# Patient Record
Sex: Male | Born: 1970 | Race: Black or African American | Hispanic: No | Marital: Married | Smoking: Never smoker
Health system: Southern US, Community
[De-identification: ages and names within clinical notes are randomized; demographics above are authoritative.]

## PROBLEM LIST (undated history)

## (undated) DIAGNOSIS — T7840XA Allergy, unspecified, initial encounter: Secondary | ICD-10-CM

## (undated) DIAGNOSIS — R569 Unspecified convulsions: Secondary | ICD-10-CM

## (undated) DIAGNOSIS — J45909 Unspecified asthma, uncomplicated: Secondary | ICD-10-CM

## (undated) HISTORY — PX: OTHER SURGICAL HISTORY: SHX169

## (undated) HISTORY — DX: Allergy, unspecified, initial encounter: T78.40XA

## (undated) HISTORY — PX: KNEE ARTHROSCOPY: SUR90

## (undated) HISTORY — DX: Unspecified asthma, uncomplicated: J45.909

---

## 2001-04-29 DIAGNOSIS — R569 Unspecified convulsions: Secondary | ICD-10-CM

## 2001-04-29 HISTORY — DX: Unspecified convulsions: R56.9

## 2013-03-16 ENCOUNTER — Encounter (HOSPITAL_COMMUNITY): Payer: Self-pay | Admitting: Emergency Medicine

## 2013-03-16 ENCOUNTER — Emergency Department (HOSPITAL_COMMUNITY)
Admission: EM | Admit: 2013-03-16 | Discharge: 2013-03-16 | Disposition: A | Discharge status: Home / Self Care | Payer: BC Managed Care – PPO | Source: Home / Self Care | Attending: Emergency Medicine | Admitting: Emergency Medicine

## 2013-03-16 DIAGNOSIS — R569 Unspecified convulsions: Secondary | ICD-10-CM

## 2013-03-16 HISTORY — DX: Unspecified convulsions: R56.9

## 2013-03-16 MED ORDER — LAMOTRIGINE 100 MG PO TABS: 100.0000 mg | ORAL_TABLET | Freq: Every day | ORAL | Status: DC

## 2013-03-16 NOTE — ED Provider Notes (Signed)
Chief Complaint:   Chief Complaint  Patient presents with  . 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 He recently moved here from [facility] and has not yet found a primary care physician or neurologist. He has had seizures since 2001. He's only actually had 2 seizures. One occurred in 2001, then he was placed in the medical. At one point in the last 13 years, his neurologist tried to wean him off he'll make: He has another seizure. He was put back on it and has not had another seizure since that. He denies any medication side effects. He's had no other symptoms including no headaches, diplopia, blurry vision, numbness, tingling, paresthesias, weakness, difficulty with speech, swallowing, or ambulation. He has had no prior history of elevated blood pressure.  Review of Systems:  Other than noted above, the patient denies any of the following symptoms: Systemic:  No fever, chills, fatigue, photophobia, stiff neck. Eye:  No redness, eye pain, discharge, blurred vision, or diplopia. ENT:  No nasal congestion, rhinorrhea, sinus pressure or pain, sneezing, earache, or sore throat.  No jaw claudication. Neuro:  No paresthesias, loss of consciousness, seizure activity, muscle weakness, trouble with coordination or gait, trouble speaking or swallowing. Psych:  No depression, anxiety or trouble sleeping.  PMFSH:  Past medical history, family history, social history, meds, and allergies were reviewed.   Physical Exam:   Vital signs:  BP 160/106  Pulse 82  Temp(Src) 98 F (36.7 C) (Oral)  Resp 16  SpO2 95% General:  Alert and oriented.  In no distress. Eye:  Lids and conjunctivas normal.  PERRL,  Full EOMs.  Fundi benign with normal discs and vessels. ENT:  No cranial or facial tenderness to palpation.  TMs and canals clear.  Nasal mucosa was normal and uncongested without any drainage. No intra oral  lesions, pharynx clear, mucous membranes moist, dentition normal. Neck:  Supple, full ROM, no tenderness to palpation.  No adenopathy or mass. No carotid bruit. Lungs: Clear to auscultation. Heart: Regular rhythm, no gallop or murmur. Neuro:  Alert and orented times 3.  Speech was clear, fluent, and appropriate.  Cranial nerves intact. No pronator drift, muscle strength normal. Finger to nose normal.  DTRs were 2+ and symmetrical.Station and gait were normal.  Romberg's sign was normal.  Able to perform tandem gait well. Psych:  Normal affect.  Assessment:  The encounter diagnosis was Seizures.  I will give him enough medication to last for several months. He was given the name of a neurologist and a family physician to establish with in town here. Suggest he followup for his blood pressure in the near future.  Plan:   1.  Meds:  The following meds were prescribed:   Discharge Medication List as of 03/16/2013  2:40 PM    START taking these medications   Details  lamoTRIgine (LAMICTAL) 100 MG tablet Take 1 tablet (100 mg total) by mouth daily., Starting 03/16/2013, Until Discontinued, Normal        2.  Patient Education/Counseling:  The patient was given appropriate handouts, self care instructions, and instructed in symptomatic relief.    3.  Follow up:  The patient was told to follow up if no better in 3 to 4 days, if becoming worse in any way, and given some red flag symptoms such as any seizure activity or new neurological symptoms which would prompt immediate return.  Follow up with Dr. Everlena Cooper for his seizures, and with Dr. Maryelizabeth Rowan for primary care, including high blood pressure.      Reuben Likes, MD 03/16/13 [phone]

## 2013-03-16 NOTE — ED Notes (Signed)
Pt has recently moved here from [facility] and is asking for a refill of Lamictal     His last seizure was in 2001    He has no complaints

## 2013-03-19 ENCOUNTER — Telehealth (HOSPITAL_COMMUNITY): Payer: Self-pay | Admitting: *Deleted

## 2013-03-19 NOTE — ED Notes (Signed)
Pt. called on VM and said the Rx. for Lamictal was written for daily and he has taken it BID for 13 yrs.  The pharmacy only gave him #30 instead of the 60.  I showed it to Dr. Mikel Cella and she said BID is OK.  I told the pt. I would call the pharmacy and correct it.  I called [facility] on [address] @ [phone] and told the pharmacist it should be BID #60 with 3 refills. She said she would take care of it. Vassie Moselle 03/19/2013

## 2013-11-29 ENCOUNTER — Ambulatory Visit
Admission: RE | Admit: 2013-11-29 | Discharge: 2013-11-29 | Disposition: A | Discharge status: Home / Self Care | Payer: BC Managed Care – PPO | Source: Ambulatory Visit | Attending: Family Medicine | Admitting: Family Medicine

## 2013-11-29 ENCOUNTER — Other Ambulatory Visit: Payer: Self-pay | Admitting: Family Medicine

## 2013-11-29 DIAGNOSIS — R109 Unspecified abdominal pain: Secondary | ICD-10-CM

## 2014-01-20 ENCOUNTER — Emergency Department (HOSPITAL_COMMUNITY): Payer: BC Managed Care – PPO

## 2014-01-20 ENCOUNTER — Encounter (HOSPITAL_COMMUNITY): Payer: Self-pay | Admitting: Emergency Medicine

## 2014-01-20 ENCOUNTER — Emergency Department (HOSPITAL_COMMUNITY)
Admission: EM | Admit: 2014-01-20 | Discharge: 2014-01-20 | Disposition: A | Discharge status: Home / Self Care | Payer: BC Managed Care – PPO | Attending: Emergency Medicine | Admitting: Emergency Medicine

## 2014-01-20 DIAGNOSIS — W1809XA Striking against other object with subsequent fall, initial encounter: Secondary | ICD-10-CM | POA: Insufficient documentation

## 2014-01-20 DIAGNOSIS — W503XXA Accidental bite by another person, initial encounter: Secondary | ICD-10-CM | POA: Diagnosis not present

## 2014-01-20 DIAGNOSIS — S0993XA Unspecified injury of face, initial encounter: Secondary | ICD-10-CM | POA: Diagnosis not present

## 2014-01-20 DIAGNOSIS — S0990XA Unspecified injury of head, initial encounter: Secondary | ICD-10-CM | POA: Insufficient documentation

## 2014-01-20 DIAGNOSIS — G40909 Epilepsy, unspecified, not intractable, without status epilepticus: Secondary | ICD-10-CM | POA: Diagnosis present

## 2014-01-20 DIAGNOSIS — Z79899 Other long term (current) drug therapy: Secondary | ICD-10-CM | POA: Insufficient documentation

## 2014-01-20 DIAGNOSIS — R569 Unspecified convulsions: Secondary | ICD-10-CM

## 2014-01-20 DIAGNOSIS — Y9289 Other specified places as the place of occurrence of the external cause: Secondary | ICD-10-CM | POA: Diagnosis not present

## 2014-01-20 DIAGNOSIS — Z760 Encounter for issue of repeat prescription: Secondary | ICD-10-CM | POA: Diagnosis not present

## 2014-01-20 DIAGNOSIS — E876 Hypokalemia: Secondary | ICD-10-CM | POA: Insufficient documentation

## 2014-01-20 DIAGNOSIS — Y9389 Activity, other specified: Secondary | ICD-10-CM | POA: Insufficient documentation

## 2014-01-20 DIAGNOSIS — S199XXA Unspecified injury of neck, initial encounter: Secondary | ICD-10-CM

## 2014-01-20 DIAGNOSIS — IMO0002 Reserved for concepts with insufficient information to code with codable children: Secondary | ICD-10-CM | POA: Diagnosis not present

## 2014-01-20 LAB — COMPREHENSIVE METABOLIC PANEL
ALK PHOS: 72 U/L (ref 39–117)
ALT: 49 U/L (ref 0–53)
AST: 30 U/L (ref 0–37)
Albumin: 4 g/dL (ref 3.5–5.2)
Anion gap: 14 (ref 5–15)
BUN: 13 mg/dL (ref 6–23)
CO2: 26 mEq/L (ref 19–32)
CREATININE: 1.18 mg/dL (ref 0.50–1.35)
Calcium: 9.8 mg/dL (ref 8.4–10.5)
Chloride: 101 mEq/L (ref 96–112)
GFR calc non Af Amer: 75 mL/min — ABNORMAL LOW (ref >90)
GFR, EST AFRICAN AMERICAN: 87 mL/min — AB (ref >90)
Glucose, Bld: 124 mg/dL — ABNORMAL HIGH (ref 70–99)
POTASSIUM: 3.1 meq/L — AB (ref 3.7–5.3)
Sodium: 141 mEq/L (ref 137–147)
TOTAL PROTEIN: 7.5 g/dL (ref 6.0–8.3)
Total Bilirubin: 0.3 mg/dL (ref 0.3–1.2)

## 2014-01-20 LAB — CBC WITH DIFFERENTIAL/PLATELET
Basophils Absolute: 0 10*3/uL (ref 0.0–0.1)
Basophils Relative: 0 % (ref 0–1)
EOS ABS: 0.1 10*3/uL (ref 0.0–0.7)
Eosinophils Relative: 1 % (ref 0–5)
HEMATOCRIT: 44.3 % (ref 39.0–52.0)
HEMOGLOBIN: 15.4 g/dL (ref 13.0–17.0)
LYMPHS ABS: 2.1 10*3/uL (ref 0.7–4.0)
Lymphocytes Relative: 17 % (ref 12–46)
MCH: 30.4 pg (ref 26.0–34.0)
MCHC: 34.8 g/dL (ref 30.0–36.0)
MCV: 87.4 fL (ref 78.0–100.0)
MONO ABS: 0.6 10*3/uL (ref 0.1–1.0)
MONOS PCT: 5 % (ref 3–12)
NEUTROS PCT: 77 % (ref 43–77)
Neutro Abs: 9.5 10*3/uL — ABNORMAL HIGH (ref 1.7–7.7)
Platelets: 233 10*3/uL (ref 150–400)
RBC: 5.07 MIL/uL (ref 4.22–5.81)
RDW: 13.4 % (ref 11.5–15.5)
WBC: 12.2 10*3/uL — ABNORMAL HIGH (ref 4.0–10.5)

## 2014-01-20 MED ORDER — LAMOTRIGINE 100 MG PO TABS
100.0000 mg | ORAL_TABLET | Freq: Once | ORAL | Status: AC
  Administered 2014-01-20: 100 mg via ORAL
  Filled 2014-01-20: qty 1

## 2014-01-20 MED ORDER — LAMOTRIGINE 100 MG PO TABS: 100.0000 mg | ORAL_TABLET | Freq: Two times a day (BID) | ORAL | Status: DC

## 2014-01-20 MED ORDER — LISINOPRIL 20 MG PO TABS: 40.0000 mg | ORAL_TABLET | Freq: Every day | ORAL | Status: DC

## 2014-01-20 MED ORDER — POTASSIUM CHLORIDE CRYS ER 20 MEQ PO TBCR
40.0000 meq | EXTENDED_RELEASE_TABLET | Freq: Once | ORAL | Status: AC
  Administered 2014-01-20: 40 meq via ORAL
  Filled 2014-01-20: qty 2

## 2014-01-20 NOTE — ED Notes (Signed)
Pt presents via EMS from grocery store with c/o seizure. Pt has a hx of same, discontinued himself from his lamictal because he had not had a seizure for a couple of years. Pt reportedly has a full [place] seizure, fell and hit the back of his head on the concrete, redness to the back of the head. Also bit his lip, bleeding to that area, controlled at this time. Pt is more alert upon arrival, able to answer questions for EMS.

## 2014-01-20 NOTE — ED Provider Notes (Signed)
CSN: [id number]     Arrival date & time 01/20/14  1641 History   First MD Initiated Contact with Patient 01/20/14 1644     Chief Complaint  Patient presents with  . Seizures     (Consider location/radiation/quality/duration/timing/severity/associated sxs/prior Treatment) HPI 43 year old male presents with a seizure that occurred prior to arrival. He is on Lamictal and thinks he may have missed the last one to 2 doses. He's been on this for several years. His last seizure was in 2002. Back then he had 2 seizures within a couple weeks. He recently got over an upper respiratory infection. He has no more symptoms of this. Denies any recent fevers. No headaches, vomiting, or diarrhea. He fell and hit the back of his head during a seizure. He is not currently follow with a neurologist. He states he feels little hazy but has been improving the further out from the seizure he is gone. He bit his tongue on the right side during the seizure  Past Medical History  Diagnosis Date  . Seizures    Past Surgical History  Procedure Laterality Date  . Knee arthroscopy     No family history on file. History  Substance Use Topics  . Smoking status: Never Smoker   . Smokeless tobacco: Not on file  . Alcohol Use: Yes     Comment: occasionally    Review of Systems  Constitutional: Negative for fever.  Gastrointestinal: Negative for vomiting.  Musculoskeletal: Positive for neck pain (left lateral).  Skin: Positive for wound.  Neurological: Positive for seizures. Negative for weakness, numbness and headaches.  All other systems reviewed and are negative.     Allergies  Review of patient's allergies indicates no known allergies.  Home Medications   Prior to Admission medications   Medication Sig Start Date End Date Taking? Authorizing Provider  lamoTRIgine (LAMICTAL) 100 MG tablet Take 1 tablet (100 mg total) by mouth daily. 03/16/13   Reuben Likes, MD  lamoTRIgine (LAMICTAL) 100 MG tablet  Take 100 mg by mouth daily.    Historical Provider, MD  sildenafil (VIAGRA) 25 MG tablet Take 25 mg by mouth as needed for erectile dysfunction.    Historical Provider, MD   BP 149/86  Pulse 124  Temp(Src) 98 F (36.7 C) (Oral)  Resp 26  SpO2 90% Physical Exam  Nursing note and vitals reviewed. Constitutional: He is oriented to person, place, and time. He appears well-developed and well-nourished.  HENT:  Head: Normocephalic.    Right Ear: External ear normal.  Left Ear: External ear normal.  Nose: Nose normal.  Mouth/Throat:    Eyes: EOM are normal. Pupils are equal, round, and reactive to light. Right eye exhibits no discharge. Left eye exhibits no discharge.  Neck: Normal range of motion. Neck supple. Muscular tenderness (left lateral near SCM) present. No spinous process tenderness present.  Cardiovascular: Normal rate, regular rhythm, normal heart sounds and intact distal pulses.   Pulmonary/Chest: Effort normal and breath sounds normal.  Abdominal: Soft. He exhibits no distension. There is no tenderness.  Musculoskeletal: He exhibits no edema.  Neurological: He is alert and oriented to person, place, and time.  CN 2-12 grossly intact. 5/5 strength in all 4 extremities  Skin: Skin is warm and dry.    ED Course  Procedures (including critical care time) Labs Review Labs Reviewed  CBC WITH DIFFERENTIAL - Abnormal; Notable for the following:    WBC 12.2 (*)    Neutro Abs 9.5 (*)  All other components within normal limits  COMPREHENSIVE METABOLIC PANEL - Abnormal; Notable for the following:    Potassium 3.1 (*)    Glucose, Bld 124 (*)    GFR calc non Af Amer 75 (*)    GFR calc Af Amer 87 (*)    All other components within normal limits    Imaging Review Ct Head Wo Contrast  01/20/2014   CLINICAL DATA:  Seizure with occipital head injury  EXAM: CT HEAD WITHOUT CONTRAST  TECHNIQUE: Contiguous axial images were obtained from the base of the skull through the vertex  without intravenous contrast.  COMPARISON:  None.  FINDINGS: No mass lesion. No midline shift. No acute hemorrhage or hematoma. No extra-axial fluid collections. No evidence of acute infarction. Calvarium is intact.  IMPRESSION: Negative head CT   Electronically Signed   By: Esperanza Heir M.D.   On: 01/20/2014 18:08     EKG Interpretation None      MDM   Final diagnoses:  Seizure  Hypokalemia    Patient with his first seizure in over 10 years. Could be related to recent URI last week as well as he thinks he missed one or 2 doses of his antiepileptic. At this point he is neurologically intact, awake, alert, and in no distress. He has superficial tongue injury from the seizure but does not require repair at this time. Workup here is benign except for mild hypokalemia. This was replaced orally. At this point will refill his Lamictal and lisinopril as he is almost out and refer him to a local neurologist.    Audree Camel, MD 01/20/14 2240

## 2014-01-20 NOTE — Discharge Instructions (Signed)
Seizure, Adult A seizure is abnormal electrical activity in the brain. Seizures usually last from 30 seconds to 2 minutes. There are various types of seizures. Before a seizure, you may have a warning sensation (aura) that a seizure is about to occur. An aura may include the following symptoms:   Fear or anxiety.  Nausea.  Feeling like the room is spinning (vertigo).  Vision changes, such as seeing flashing lights or spots. Common symptoms during a seizure include:  A change in attention or behavior (altered mental status).  Convulsions with rhythmic jerking movements.  Drooling.  Rapid eye movements.  Grunting.  Loss of bladder and bowel control.  Bitter taste in the mouth.  Tongue biting. After a seizure, you may feel confused and sleepy. You may also have an injury resulting from convulsions during the seizure. HOME CARE INSTRUCTIONS   If you are given medicines, take them exactly as prescribed by your health care provider.  Keep all follow-up appointments as directed by your health care provider.  Do not swim or drive or engage in risky activity during which a seizure could cause further injury to you or others until your health care provider says it is OK.  Get adequate rest.  Teach friends and family what to do if you have a seizure. They should:  Lay you on the ground to prevent a fall.  Put a cushion under your head.  Loosen any tight clothing around your neck.  Turn you on your side. If vomiting occurs, this helps keep your airway clear.  Stay with you until you recover.  Know whether or not you need emergency care. SEEK IMMEDIATE MEDICAL CARE IF:  The seizure lasts longer than 5 minutes.  The seizure is severe or you do not wake up immediately after the seizure.  You have an altered mental status after the seizure.  You are having more frequent or worsening seizures. Someone should drive you to the emergency department or call local emergency  services (911 in [place] MAKE SURE YOU:  Understand these instructions.  Will watch your condition.  Will get help right away if you are not doing well or get worse. Document Released: 04/12/2000 Document Revised: 02/03/2013 Document Reviewed: 11/25/2012 [facility] Patient Information 2015 [place], [place]. This information is not intended to replace advice given to you by your health care provider. Make sure you discuss any questions you have with your health care provider.      Hypokalemia Hypokalemia means that the amount of potassium in the blood is lower than normal.Potassium is a chemical, called an electrolyte, that helps regulate the amount of fluid in the body. It also stimulates muscle contraction and helps nerves function properly.Most of the body's potassium is inside of cells, and only a very small amount is in the blood. Because the amount in the blood is so small, minor changes can be life-threatening. CAUSES  Antibiotics.  Diarrhea or vomiting.  Using laxatives too much, which can cause diarrhea.  Chronic kidney disease.  Water pills (diuretics).  Eating disorders (bulimia).  Low magnesium level.  Sweating a lot. SIGNS AND SYMPTOMS  Weakness.  Constipation.  Fatigue.  Muscle cramps.  Mental confusion.  Skipped heartbeats or irregular heartbeat (palpitations).  Tingling or numbness. DIAGNOSIS  Your health care provider can diagnose hypokalemia with blood tests. In addition to checking your potassium level, your health care provider may also check other lab tests. TREATMENT Hypokalemia can be treated with potassium supplements taken by mouth or adjustments in your  current medicines. If your potassium level is very low, you may need to get potassium through a vein (IV) and be monitored in the hospital. A diet high in potassium is also helpful. Foods high in potassium are:  Nuts, such as peanuts and pistachios.  Seeds, such as sunflower seeds and  pumpkin seeds.  Peas, lentils, and lima beans.  Whole grain and bran cereals and breads.  Fresh fruit and vegetables, such as apricots, avocado, bananas, cantaloupe, kiwi, oranges, tomatoes, asparagus, and potatoes.  Orange and tomato juices.  Red meats.  Fruit yogurt. HOME CARE INSTRUCTIONS  Take all medicines as prescribed by your health care provider.  Maintain a healthy diet by including nutritious food, such as fruits, vegetables, nuts, whole grains, and lean meats.  If you are taking a laxative, be sure to follow the directions on the label. SEEK MEDICAL CARE IF:  Your weakness gets worse.  You feel your heart pounding or racing.  You are vomiting or having diarrhea.  You are diabetic and having trouble keeping your blood glucose in the normal range. SEEK IMMEDIATE MEDICAL CARE IF:  You have chest pain, shortness of breath, or dizziness.  You are vomiting or having diarrhea for more than 2 days.  You faint. MAKE SURE YOU:   Understand these instructions.  Will watch your condition.  Will get help right away if you are not doing well or get worse. Document Released: 04/15/2005 Document Revised: 02/03/2013 Document Reviewed: 10/16/2012 [facility] Patient Information 2015 [place], [place]. This information is not intended to replace advice given to you by your health care provider. Make sure you discuss any questions you have with your health care provider.

## 2014-01-20 NOTE — ED Notes (Signed)
Bed: ZO10 Expected date:  Expected time:  Means of arrival:  Comments: seizure

## 2014-01-24 ENCOUNTER — Encounter: Payer: Self-pay | Admitting: Neurology

## 2014-01-24 ENCOUNTER — Ambulatory Visit (INDEPENDENT_AMBULATORY_CARE_PROVIDER_SITE_OTHER): Payer: BC Managed Care – PPO | Admitting: Neurology

## 2014-01-24 VITALS — BP 143/96 | HR 72 | Ht 74.0 in | Wt 272.0 lb

## 2014-01-24 DIAGNOSIS — R569 Unspecified convulsions: Secondary | ICD-10-CM | POA: Insufficient documentation

## 2014-01-24 NOTE — Progress Notes (Signed)
[facility]   Provider:  Dr Hosie Poisson Referring Provider: Audree Camel, MD Primary Care Physician:  Maryelizabeth Rowan, MD  CC:  seizures  HPI:  Marc Hayden is a 43 y.o. male here for seizures.   Initial seizure was in 2002. During episodes he has LOC, he gets very stiff and tense and then has generalized shaking for 1 to 2 minutes. No loss of bowel/bladder. Did bite his tongue with this most recent event. Notes severe pain and discomfort related to this. Has had 3 events total. 2nd event was related to trying to change his medication, 3rd event related to missing 2 doses of his lamictal. Has had MRI and EEG in the past, recently had head CT. Reports all tests were unremarkable. Unclear etiology of events. Confused post ictal. Initially was started on dilantin and then switched to lamictal. Tolerating it well.   Notes severe concussion in high school with LOC. Social EtOH, no drug use. No recent fevers, illnesses. No family history of seizures. He is otherwise healthy.  ED notes: 43 year old male presents with a seizure that occurred prior to arrival. He is on Lamictal and thinks he may have missed the last one to 2 doses. He's been on this for several years. His last seizure was in 2002. Back then he had 2 seizures within a couple weeks. He recently got over an upper respiratory infection. He has no more symptoms of this. Denies any recent fevers. No headaches, vomiting, or diarrhea. He fell and hit the back of his head during a seizure. He is not currently follow with a neurologist. He states he feels little hazy but has been improving the further out from the seizure he is gone. He bit his tongue on the right side during the seizure   Concerns/Questions:Review of Systems: Out of a complete 14 system review, the patient complains of only the following symptoms, and all other reviewed systems are negative. + seizure, snoring,   History   Social History  . Marital  Status: Married    Spouse Name: N/A    Number of Children: N/A  . Years of Education: N/A   Occupational History  . Not on file.   Social History Main Topics  . Smoking status: Never Smoker   . Smokeless tobacco: Not on file  . Alcohol Use: Yes     Comment: occasionally  . Drug Use: No  . Sexual Activity: Not on file   Other Topics Concern  . Not on file   Social History Narrative  . No narrative on file    No family history on file.  Past Medical History  Diagnosis Date  . Seizures     Past Surgical History  Procedure Laterality Date  . Knee arthroscopy      Current Outpatient Prescriptions  Medication Sig Dispense Refill  . lamoTRIgine (LAMICTAL) 100 MG tablet Take 1 tablet (100 mg total) by mouth 2 (two) times daily.  60 tablet  1  . lisinopril (PRINIVIL,ZESTRIL) 20 MG tablet Take 2 tablets (40 mg total) by mouth daily.  60 tablet  1  . sildenafil (VIAGRA) 25 MG tablet Take 25 mg by mouth as needed for erectile dysfunction.       No current facility-administered medications for this visit.    Allergies as of 01/24/2014  . (No Known Allergies)    Vitals: There were no vitals taken for this visit. Last Weight:  Wt Readings from Last 1 Encounters:  No data found for Wt  Last Height:   Ht Readings from Last 1 Encounters:  No data found for Ht     Physical exam: Exam: Gen: NAD, conversant Eyes: anicteric sclerae, moist conjunctivae HENT: Atraumatic, oropharynx clear, erythematous swollen right lateral portion of tongue Lungs: CTA, no wheezing, rales, rhonic                          CV: RRR, no MRG Abdomen: Soft, non-tender;  Extremities: No peripheral edema  Skin: Normal temperature, no rash,  Psych: Appropriate affect, pleasant  Neuro: MS: AA&Ox3, appropriately interactive, normal affect  Speech: fluent w/o paraphasic error  Memory: good recent and remote recall  CN: PERRL, EOMI no nystagmus, no ptosis, sensation intact to LT V1-V3  bilat, face symmetric, no weakness, hearing grossly intact, palate elevates symmetrically, shoulder shrug 5/5 bilat,  tongue protrudes midline, no fasiculations noted.  Motor: normal bulk and tone Strength: 5/5  In all extremities  Coord: rapid alternating and point-to-point (FNF, HTS) movements intact.  Reflexes: symmetrical, bilat downgoing toes  Sens: LT intact in all extremities  Gait: posture, stance, stride and arm-swing normal. .   Assessment:  After physical and neurologic examination, review of laboratory studies, imaging, neurophysiology testing and pre-existing records, assessment will be reviewed on the problem list.  Plan:  Treatment plan and additional workup will be reviewed under Problem List.  1)Seizures 2)Tongue laceration  42y/o gentleman with history of seizure disorder presenting for initial evaluation of breakthrough seizure. Recent event likely related to missing 2 doses of his lamictal. Unclear etiology of his seizures, notes having had an unremarkable EEG and MRI in the past. Will have him fax prior records to our office. Will continue on lamictal  BID. Counseled patient to avoid driving for 6 months due to breakthrough event. Follow up as needed. Will refer to ENT for further evaluation of tongue laceration.   Elspeth Cho, DO  [facility] [address] [place], [place] [address]  Phone [phone] Fax [phone]

## 2014-02-18 ENCOUNTER — Telehealth: Payer: Self-pay | Admitting: Neurology

## 2014-02-18 NOTE — Telephone Encounter (Signed)
Pt has seen another provider. Pt canceled his NP appt w/ Dr. Karel JarvisAquino on 02/22/14

## 2014-02-22 ENCOUNTER — Ambulatory Visit: Payer: BC Managed Care – PPO | Admitting: Neurology

## 2015-10-17 DIAGNOSIS — E782 Mixed hyperlipidemia: Secondary | ICD-10-CM | POA: Diagnosis not present

## 2015-10-17 DIAGNOSIS — E559 Vitamin D deficiency, unspecified: Secondary | ICD-10-CM | POA: Diagnosis not present

## 2015-10-27 DIAGNOSIS — N529 Male erectile dysfunction, unspecified: Secondary | ICD-10-CM | POA: Diagnosis not present

## 2015-10-27 DIAGNOSIS — E559 Vitamin D deficiency, unspecified: Secondary | ICD-10-CM | POA: Diagnosis not present

## 2015-10-27 DIAGNOSIS — Z23 Encounter for immunization: Secondary | ICD-10-CM | POA: Diagnosis not present

## 2015-10-27 DIAGNOSIS — E782 Mixed hyperlipidemia: Secondary | ICD-10-CM | POA: Diagnosis not present

## 2015-10-27 DIAGNOSIS — Z6833 Body mass index (BMI) 33.0-33.9, adult: Secondary | ICD-10-CM | POA: Diagnosis not present

## 2015-10-27 DIAGNOSIS — G40209 Localization-related (focal) (partial) symptomatic epilepsy and epileptic syndromes with complex partial seizures, not intractable, without status epilepticus: Secondary | ICD-10-CM | POA: Diagnosis not present

## 2016-02-13 DIAGNOSIS — E782 Mixed hyperlipidemia: Secondary | ICD-10-CM | POA: Diagnosis not present

## 2016-02-13 DIAGNOSIS — I10 Essential (primary) hypertension: Secondary | ICD-10-CM | POA: Diagnosis not present

## 2016-02-13 DIAGNOSIS — Z6833 Body mass index (BMI) 33.0-33.9, adult: Secondary | ICD-10-CM | POA: Diagnosis not present

## 2016-02-13 DIAGNOSIS — G40209 Localization-related (focal) (partial) symptomatic epilepsy and epileptic syndromes with complex partial seizures, not intractable, without status epilepticus: Secondary | ICD-10-CM | POA: Diagnosis not present

## 2016-02-21 DIAGNOSIS — R569 Unspecified convulsions: Secondary | ICD-10-CM | POA: Diagnosis not present

## 2016-02-21 DIAGNOSIS — I10 Essential (primary) hypertension: Secondary | ICD-10-CM | POA: Diagnosis not present

## 2016-02-21 DIAGNOSIS — M50321 Other cervical disc degeneration at C4-C5 level: Secondary | ICD-10-CM | POA: Diagnosis not present

## 2016-02-21 DIAGNOSIS — I959 Hypotension, unspecified: Secondary | ICD-10-CM | POA: Diagnosis not present

## 2016-02-21 DIAGNOSIS — M5137 Other intervertebral disc degeneration, lumbosacral region: Secondary | ICD-10-CM | POA: Diagnosis not present

## 2016-02-21 DIAGNOSIS — Z79899 Other long term (current) drug therapy: Secondary | ICD-10-CM | POA: Diagnosis not present

## 2016-02-21 DIAGNOSIS — S50812A Abrasion of left forearm, initial encounter: Secondary | ICD-10-CM | POA: Diagnosis not present

## 2016-02-21 DIAGNOSIS — S00511A Abrasion of lip, initial encounter: Secondary | ICD-10-CM | POA: Diagnosis not present

## 2016-02-21 DIAGNOSIS — E872 Acidosis, unspecified: Secondary | ICD-10-CM | POA: Insufficient documentation

## 2016-02-21 DIAGNOSIS — M50322 Other cervical disc degeneration at C5-C6 level: Secondary | ICD-10-CM | POA: Diagnosis not present

## 2016-02-21 DIAGNOSIS — R9431 Abnormal electrocardiogram [ECG] [EKG]: Secondary | ICD-10-CM | POA: Diagnosis not present

## 2016-02-26 DIAGNOSIS — G40209 Localization-related (focal) (partial) symptomatic epilepsy and epileptic syndromes with complex partial seizures, not intractable, without status epilepticus: Secondary | ICD-10-CM | POA: Diagnosis not present

## 2016-02-26 DIAGNOSIS — E162 Hypoglycemia, unspecified: Secondary | ICD-10-CM | POA: Diagnosis not present

## 2016-02-26 DIAGNOSIS — R51 Headache: Secondary | ICD-10-CM | POA: Diagnosis not present

## 2016-02-28 ENCOUNTER — Encounter: Payer: Self-pay | Admitting: Neurology

## 2016-02-28 ENCOUNTER — Telehealth: Payer: Self-pay | Admitting: Neurology

## 2016-02-28 ENCOUNTER — Ambulatory Visit (INDEPENDENT_AMBULATORY_CARE_PROVIDER_SITE_OTHER): Payer: BLUE CROSS/BLUE SHIELD | Admitting: Neurology

## 2016-02-28 VITALS — BP 118/78 | HR 89 | Ht 74.0 in | Wt 268.2 lb

## 2016-02-28 DIAGNOSIS — R569 Unspecified convulsions: Secondary | ICD-10-CM

## 2016-02-28 DIAGNOSIS — G40009 Localization-related (focal) (partial) idiopathic epilepsy and epileptic syndromes with seizures of localized onset, not intractable, without status epilepticus: Secondary | ICD-10-CM | POA: Diagnosis not present

## 2016-02-28 MED ORDER — LAMOTRIGINE 25 MG PO TABS: 50.0000 mg | ORAL_TABLET | Freq: Two times a day (BID) | ORAL | 6 refills | Status: DC

## 2016-02-28 NOTE — Telephone Encounter (Signed)
PER PT [facility]ERN IS ORDERING MRI. PT STATES CAN HAVE HERE. HE COMPLETED FORM. THE ORDER IS NOT IN EPIC AT THIS TIME. CB

## 2016-02-28 NOTE — Progress Notes (Signed)
[facility]    Provider:  Dr Lucia Gaskins Referring Provider: Lewis Moccasin, MD Primary Care Physician:  Maryelizabeth Rowan, MD  HPI:  Namish Krise is a 45 y.o. male here for seizures. He previously saw my colleague Dr. Elspeth Cho and is here today to see me after having another seizure. He has been on Lamictal, last seizure documented was in September 2015 after missing 2 doses of his AED.  Patient was doing well since 2015, no more events, no missing medications, he was driving and his vision started "closing", coworker was in the car. This was quicker, but similar to his previous seizures, he felt his vision closing in on him, his foot was on the gas and coworker had to steer the wheel and he was stiff and he was sore afterwards. He bit his jaw. No urination. He remembers waking up behind the wheel and then he remembers being in the ambulance. He was "in and out" sleepy and weak. He has had a full workup, multiple EEGs and MRIs without etiology but this was many years ago in 2011 or earlier. He has had multiple head trauma and that is why they think he has seizures. This last seizure was during high stress and sleep deprivation. Wife is here and provides information. Also coworker who was in the car provided information, she heard him take a big breath, he started to stiffen up, total stiffness and he could not be moved, eyes open, lasted 3 minurs, mouth foamed, they called 911. He was beligerant afterwards. No recent illnesses but working hard with sleep deprivation.  I reviewed previous notes for my colleague Elspeth Cho below. I also reviewed discharge documents from recent hospitalization for break through seizure. CMP showed elevated glucose 155, CO2 16, anion gap 17 creatinine 1.61 otherwise unremarkable, CBC showed white blood cells 13 otherwise unremarkable. Reviewed CT of the chest report which showed negative CT scan of the chest with IV contrast material, CT of the  abdomen and pelvis showed no acute abdominal or pelvic findings, CT of the thoracic and lumbar spine showed mild S-shaped curvature of the thoracolumbar spine, moderate to severe L5-S1 degenerative disc narrowing, CT scan of the cervical spine showed no evidence of cervical spine fracture, mild right convex curvature of the mid cervical spine mild degenerative disc narrowing at C4-C5 and C5-C6. CT of the head was negative without contrast material, normal.  CT head 12/2013 showed No acute intracranial abnormalities including mass lesion or mass effect, hydrocephalus, extra-axial fluid collection, midline shift, hemorrhage, or acute infarction, large ischemic events (personally reviewed images)  Lamictal level 2.3. Repeat creatinine 1.02 October 30th 2017,   Previous notes:  Danne [place], MD 2015: Initial seizure was in 2002. During episodes he has LOC, he gets very stiff and tense and then has generalized shaking for 1 to 2 minutes. No loss of bowel/bladder. Did bite his tongue with this most recent event. Notes severe pain and discomfort related to this. Has had 3 events total. 2nd event was related to trying to change his medication, 3rd event related to missing 2 doses of his lamictal. Has had MRI and EEG in the past, recently had head CT. Reports all tests were unremarkable. Unclear etiology of events. Confused post ictal. Initially was started on dilantin and then switched to lamictal. Tolerating it well.   Notes severe concussion in high school with LOC. Social EtOH, no drug use. No recent fevers, illnesses. No family history of seizures. He is otherwise healthy.  ED  notes: 45 year old male presents with a seizure that occurred prior to arrival. He is on Lamictal and thinks he may have missed the last one to 2 doses. He's been on this for several years. His last seizure was in 2002. Back then he had 2 seizures within a couple weeks. He recently got over an upper respiratory infection. He has no more  symptoms of this. Denies any recent fevers. No headaches, vomiting, or diarrhea. He fell and hit the back of his head during a seizure. He is not currently follow with a neurologist. He states he feels little hazy but has been improving the further out from the seizure he is gone. He bit his tongue on the right side during the seizure   Review of Systems: Patient complains of symptoms per HPI as well as the following symptoms: no fever, no chills. Pertinent negatives per HPI. All others negative.   Social History   Social History  . Marital status: Married    Spouse name: Alyce  . Number of children: 2  . Years of education: Grad Sch   Occupational History  .  [employer]    [employer]   Social History Main Topics  . Smoking status: Never Smoker  . Smokeless tobacco: Never Used  . Alcohol use Yes     Comment: 2-3 drinks per week  . Drug use: No  . Sexual activity: Not on file   Other Topics Concern  . Not on file   Social History Narrative   Patient lives at home with his spouse and daughter.   Caffeine Use: 2 cups daily    Family History  Problem Relation Age of Onset  . Seizures Neg Hx     Past Medical History:  Diagnosis Date  . Seizures (HCC) 2003    Past Surgical History:  Procedure Laterality Date  . KNEE ARTHROSCOPY Right     Current Outpatient Prescriptions  Medication Sig Dispense Refill  . lamoTRIgine (LAMICTAL) 100 MG tablet Take 1 tablet (100 mg total) by mouth 2 (two) times daily. 60 tablet 1  . lisinopril (PRINIVIL,ZESTRIL) 40 MG tablet Take 40 mg by mouth daily.  3  . lamoTRIgine (LAMICTAL) 25 MG tablet Take 2 tablets (50 mg total) by mouth 2 (two) times daily. 120 tablet 6   No current facility-administered medications for this visit.     Allergies as of 02/28/2016  . (No Known Allergies)    Vitals: BP 118/78 (BP Location: Right Arm, Patient Position: Sitting, Cuff Size: Normal)   Pulse 89   Ht 6\' 2"  (1.88 m)   Wt 268 lb 3.2  oz (121.7 kg)   SpO2 97%   BMI 34.43 kg/m  Last Weight:  Wt Readings from Last 1 Encounters:  02/28/16 268 lb 3.2 oz (121.7 kg)   Last Height:   Ht Readings from Last 1 Encounters:  02/28/16 6\' 2"  (1.88 m)    Physical exam: Exam: Gen: NAD, conversant, well nourised, obese, well groomed                     CV: RRR, no MRG. No Carotid Bruits. No peripheral edema, warm, nontender Eyes: Conjunctivae clear without exudates or hemorrhage  Neuro: Detailed Neurologic Exam  Speech:    Speech is normal; fluent and spontaneous with normal comprehension.  Cognition:    The patient is oriented to person, place, and time;     recent and remote memory intact;     language fluent;  normal attention, concentration,     fund of knowledge Cranial Nerves:    The pupils are equal, round, and reactive to light. The fundi are normal and spontaneous venous pulsations are present. Visual fields are full to finger confrontation. Extraocular movements are intact. Trigeminal sensation is intact and the muscles of mastication are normal. The face is symmetric. The palate elevates in the midline. Hearing intact. Voice is normal. Shoulder shrug is normal. The tongue has normal motion without fasciculations.   Coordination:    Normal finger to nose and heel to shin. Normal rapid alternating movements.   Gait:    Heel-toe and tandem gait are normal.   Motor Observation:    No asymmetry, no atrophy, and no involuntary movements noted. Tone:    Normal muscle tone.    Posture:    Posture is normal. normal erect    Strength:    Strength is V/V in the upper and lower limbs.      Sensation: intact to LT     Reflex Exam:  DTR's:    Deep tendon reflexes in the upper and lower extremities are normal bilaterally.   Toes:    The toes are downgoing bilaterally.   Clonus:    Clonus is absent.      Assessment/Plan:  45y/o gentleman with history of seizure disorder presenting for evaluation of  breakthrough seizure. Recent event may be unprovoked, will increase his lamictal. Unclear etiology of his seizures, notes having had an unremarkable EEG and MRI in the past many years ago, feel repeating MRI of the brain is warranted to evaluate for lesions that may not have been seen in the initial MRI.  Repeat MRI of the brain w/wo contrast Increase Lamictal 150mg  but need to slowly titrate, watch for rash, discussed side effects as per patinet instructions  Patient is unable to drive, operate heavy machinery, perform activities at heights or participate in water activities until 6 months seizure free   Naomie DeanAntonia Kenishia Plack, MD  [facility] [address] [place], [place] [id number]  Phone [phone] Fax [phone]  A total of 40 minutes was spent face-to-face with this patient. Over half this time was spent on counseling patient on the seizure diagnosis and different diagnostic and therapeutic options available.

## 2016-02-28 NOTE — Patient Instructions (Addendum)
Remember to drink plenty of fluid, eat healthy meals and do not skip any meals. Try to eat protein with a every meal and eat a healthy snack such as fruit or nuts in between meals. Try to keep a regular sleep-wake schedule and try to exercise daily, particularly in the form of walking, 20-30 minutes a day, if you can.   As far as your medications are concerned, I would like to suggest:  - start Lamotrigine with a goal dose of 150mg  by mouth twice a day. Take your 100mg  pill twice a day in addition take the following Week 1: Take 25mg  (one pill) by mouth at night every day Week 2: Take 25mg  (one pill) by mouth in the morning and night every day Week 3: Take 50mg  (two pills) by mouth every night and 25mg  every morning  Week 4: Take 50mg  (two pills) by mouth every night and every morning   Please call with any questions or if you develop a rash call immediately and stop taking the Lamotrigine.   WARNING/CAUTION: Even though it may be rare, some people may have very bad and sometimes deadly side effects when taking a drug. Tell your doctor or get medical help right away if you have any of the following signs or symptoms that may be related to a very bad side effect: . Signs of an allergic reaction, like rash; hives; itching; red, swollen, blistered, or peeling skin with or without fever; wheezing; tightness in the chest or throat; trouble breathing or talking; unusual hoarseness; or swelling of the mouth, face, lips, tongue, or throat. . Signs of infection like fever, chills, very bad sore throat, ear or sinus pain, cough, more sputum or change in color of sputum, pain with passing urine, mouth sores, or wound that will not heal. . Signs of liver problems like dark urine, feeling tired, not hungry, upset stomach or stomach pain, light-colored stools, throwing up, or yellow skin or eyes. . Signs of kidney problems like unable to pass urine, change in how much urine is passed, blood in the urine, or a big  weight gain. Marland Kitchen. Shortness of breath, a big weight gain, or swelling in the arms or legs. . If seizures are worse or not the same after starting this drug. . Very bad muscle pain or weakness. . Very bad joint pain or swelling. . Any unexplained bruising or bleeding. . Change in eyesight. . Very bad dizziness or passing out. . Change in balance. . Not able to control eye movements. . Chest pain or pressure. . Flu-like signs. . This drug may raise the chance of a very bad brain problem called aseptic meningitis. Call your doctor right away if you have a headache, fever, chills, very upset stomach or throwing up, stiff neck, rash, bright lights bother your eyes, feeling sleepy, or feeling confused.  All drugs may cause side effects. However, many people have no side effects or only have minor side effects. Call your doctor or get medical help if any of these side effects or any other side effects bother you or do not go away: . Dizziness. . Feeling sleepy. Marland Kitchen. Upset stomach or throwing up. . Feeling tired or weak. . Shakiness. . Not able to sleep. . Runny nose. . Loose stools (diarrhea). These are not all of the side effects that may occur. If you have questions about side effects, call your doctor. Call your doctor for medical advice about side effects. You may report side effects to your national  health agency  I would like to see you back in 6 months, sooner if we need to. Please call us with any interim questions, concerns, problems, updates or refill requests.   Our phone number is [phone]. We also have an after hours call service for urgent matters and there is a physician on-call for urgent questions. For any emergencies you know to call 911 or go to the nearest emergency room  Epilepsy Epilepsy is a disorder in which a person has repeated seizures over time. A seizure is a release of abnormal electrical activity in the brain. Seizures can cause a change in attention, behavior, or the ability to remain  awake and alert (altered mental status). Seizures often involve uncontrollable shaking (convulsions).  Most people with epilepsy lead normal lives. However, people with epilepsy are at an increased risk of falls, accidents, and injuries. Therefore, it is important to begin treatment right away. CAUSES  Epilepsy has many possible causes. Anything that disturbs the normal pattern of brain cell activity can lead to seizures. This may include:   Head injury.  Birth trauma.  High fever as a child.  Stroke.  Bleeding into or around the brain.  Certain drugs.  Prolonged low oxygen, such as what occurs after CPR efforts.  Abnormal brain development.  Certain illnesses, such as meningitis, encephalitis (brain infection), malaria, and other infections.  An imbalance of nerve signaling chemicals (neurotransmitters).  SIGNS AND SYMPTOMS  The symptoms of a seizure can vary greatly from one person to another. Right before a seizure, you may have a warning (aura) that a seizure is about to occur. An aura may include the following symptoms:  Fear or anxiety.  Nausea.  Feeling like the room is spinning (vertigo).  Vision changes, such as seeing flashing lights or spots. Common symptoms during a seizure include:  Abnormal sensations, such as an abnormal smell or a bitter taste in the mouth.   Sudden, general body stiffness.   Convulsions that involve rhythmic jerking of the face, arm, or leg on one or both sides.   Sudden change in consciousness.   Appearing to be awake but not responding.   Appearing to be asleep but cannot be awakened.   Grimacing, chewing, lip smacking, drooling, tongue biting, or loss of bowel or bladder control. After a seizure, you may feel sleepy for a while. DIAGNOSIS  Your health care provider will ask about your symptoms and take a medical history. Descriptions from any witnesses to your seizures will be very helpful in the diagnosis. A physical  exam, including a detailed neurological exam, is necessary. Various tests may be done, such as:   An electroencephalogram (EEG). This is a painless test of your brain waves. In this test, a diagram is created of your brain waves. These diagrams can be interpreted by a specialist.  An MRI of the brain.   A CT scan of the brain.   A spinal tap (lumbar puncture, LP).  Blood tests to check for signs of infection or abnormal blood chemistry. TREATMENT  There is no cure for epilepsy, but it is generally treatable. Once epilepsy is diagnosed, it is important to begin treatment as soon as possible. For most people with epilepsy, seizures can be controlled with medicines. The following may also be used:  A pacemaker for the brain (vagus nerve stimulator) can be used for people with seizures that are not well controlled by medicine.  Surgery on the brain. For some people, epilepsy eventually goes away. HOME  CARE INSTRUCTIONS   Follow your health care provider's recommendations on driving and safety in normal activities.  Get enough rest. Lack of sleep can cause seizures.  Only take over-the-counter or prescription medicines as directed by your health care provider. Take any prescribed medicine exactly as directed.  Avoid any known triggers of your seizures.  Keep a seizure diary. Record what you recall about any seizure, especially any possible trigger.   Make sure the people you live and work with know that you are prone to seizures. They should receive instructions on how to help you. In general, a witness to a seizure should:   Cushion your head and body.   Turn you on your side.   Avoid unnecessarily restraining you.   Not place anything inside your mouth.   Call for emergency medical help if there is any question about what has occurred.   Follow up with your health care provider as directed. You may need regular blood tests to monitor the levels of your medicine.   SEEK MEDICAL CARE IF:   You develop signs of infection or other illness. This might increase the risk of a seizure.   You seem to be having more frequent seizures.   Your seizure pattern is changing.  SEEK IMMEDIATE MEDICAL CARE IF:   You have a seizure that does not stop after a few moments.   You have a seizure that causes any difficulty in breathing.   You have a seizure that results in a very severe headache.   You have a seizure that leaves you with the inability to speak or use a part of your body.    This information is not intended to replace advice given to you by your health care provider. Make sure you discuss any questions you have with your health care provider.   Document Released: 04/15/2005 Document Revised: 02/03/2013 Document Reviewed: 11/25/2012 Elsevier Interactive Patient Education [employer]2016 Elsevier Inc.

## 2016-02-29 ENCOUNTER — Telehealth: Payer: Self-pay | Admitting: *Deleted

## 2016-02-29 ENCOUNTER — Encounter: Payer: Self-pay | Admitting: Neurology

## 2016-02-29 ENCOUNTER — Encounter: Payer: Self-pay | Admitting: *Deleted

## 2016-02-29 DIAGNOSIS — G40009 Localization-related (focal) (partial) idiopathic epilepsy and epileptic syndromes with seizures of localized onset, not intractable, without status epilepticus: Secondary | ICD-10-CM | POA: Insufficient documentation

## 2016-02-29 NOTE — Telephone Encounter (Signed)
Called and LVM for pt advising we faxed letter to [employer] for work restrictions/return to work as requested. Fax: [phone]. Received confirmation.   Pt had signed record release form in office yesterday giving permission to fax letter.

## 2016-02-29 NOTE — Telephone Encounter (Signed)
Pt called office back. I read letter to him. He would like copy mailed to him as well. Advised I will mail him a copy. He verbalized understanding.

## 2016-03-01 ENCOUNTER — Encounter: Payer: Self-pay | Admitting: *Deleted

## 2016-03-07 NOTE — Telephone Encounter (Signed)
Scheduled his MRI in the mobile unit @ [facility] on 03/13/16

## 2016-03-12 ENCOUNTER — Telehealth: Payer: Self-pay | Admitting: Neurology

## 2016-03-12 NOTE — Telephone Encounter (Signed)
I called the patient back and he informed me he wants to cancel and wait until the beginning of the year where he can use some of his flex spending.. I informed him to let me know whenever he is ready to scheduled the MRI.

## 2016-03-12 NOTE — Telephone Encounter (Signed)
Patient has an MRI scheduled for tomorrow and would like to cancel. He wants to schedule in January when his insurance will pay more. Please call patient and discuss.

## 2016-03-13 ENCOUNTER — Other Ambulatory Visit: Payer: BLUE CROSS/BLUE SHIELD

## 2016-04-02 ENCOUNTER — Telehealth: Payer: Self-pay | Admitting: Neurology

## 2016-04-02 NOTE — Telephone Encounter (Signed)
Called [employer] back. [facility]She is wondering if there is anything they need to get from us. Advised that we normally will fill out forms they fax us or they drop off if needed. She verbalized understanding. I recommended she call DMV to have them explain process to them since they are not part of our office and to make sure they have everything they need prior to this.

## 2016-04-02 NOTE — Telephone Encounter (Signed)
Patient's wife is calling and has questions about a DMV form the patient received.

## 2016-04-11 DIAGNOSIS — Z136 Encounter for screening for cardiovascular disorders: Secondary | ICD-10-CM | POA: Diagnosis not present

## 2016-04-11 DIAGNOSIS — Z125 Encounter for screening for malignant neoplasm of prostate: Secondary | ICD-10-CM | POA: Diagnosis not present

## 2016-04-11 DIAGNOSIS — Z0000 Encounter for general adult medical examination without abnormal findings: Secondary | ICD-10-CM | POA: Diagnosis not present

## 2016-04-11 DIAGNOSIS — G40209 Localization-related (focal) (partial) symptomatic epilepsy and epileptic syndromes with complex partial seizures, not intractable, without status epilepticus: Secondary | ICD-10-CM | POA: Diagnosis not present

## 2016-04-16 DIAGNOSIS — Z0000 Encounter for general adult medical examination without abnormal findings: Secondary | ICD-10-CM | POA: Diagnosis not present

## 2016-04-16 DIAGNOSIS — Z23 Encounter for immunization: Secondary | ICD-10-CM | POA: Diagnosis not present

## 2016-04-16 DIAGNOSIS — Z6834 Body mass index (BMI) 34.0-34.9, adult: Secondary | ICD-10-CM | POA: Diagnosis not present

## 2016-05-07 DIAGNOSIS — R0981 Nasal congestion: Secondary | ICD-10-CM | POA: Diagnosis not present

## 2016-05-07 DIAGNOSIS — R05 Cough: Secondary | ICD-10-CM | POA: Diagnosis not present

## 2016-05-07 DIAGNOSIS — J018 Other acute sinusitis: Secondary | ICD-10-CM | POA: Diagnosis not present

## 2016-07-01 ENCOUNTER — Telehealth: Payer: Self-pay | Admitting: Neurology

## 2016-07-01 NOTE — Telephone Encounter (Signed)
Pt called request to be seen sooner than schedule appt 5/8. [place] SpanielSaid he is needing to make some trips and the notes say he cannot travel. He advised that Dr A told him to call back when he was ready to travel. He says DMV has given him his license with no restrictions. Please call with an appt time

## 2016-07-03 DIAGNOSIS — G40209 Localization-related (focal) (partial) symptomatic epilepsy and epileptic syndromes with complex partial seizures, not intractable, without status epilepticus: Secondary | ICD-10-CM | POA: Diagnosis not present

## 2016-07-03 DIAGNOSIS — Z6834 Body mass index (BMI) 34.0-34.9, adult: Secondary | ICD-10-CM | POA: Diagnosis not present

## 2016-07-03 DIAGNOSIS — I10 Essential (primary) hypertension: Secondary | ICD-10-CM | POA: Diagnosis not present

## 2016-07-04 NOTE — Telephone Encounter (Signed)
Returned pt TC. Reports that he has MRI scheduled 07/17/16. F/u appt scheduled the following Tues 07/23/16 @ 8 am.

## 2016-07-17 ENCOUNTER — Ambulatory Visit (INDEPENDENT_AMBULATORY_CARE_PROVIDER_SITE_OTHER): Payer: BLUE CROSS/BLUE SHIELD

## 2016-07-17 DIAGNOSIS — R569 Unspecified convulsions: Secondary | ICD-10-CM

## 2016-07-17 DIAGNOSIS — G40009 Localization-related (focal) (partial) idiopathic epilepsy and epileptic syndromes with seizures of localized onset, not intractable, without status epilepticus: Secondary | ICD-10-CM | POA: Diagnosis not present

## 2016-07-17 MED ORDER — GADOPENTETATE DIMEGLUMINE 469.01 MG/ML IV SOLN: 20.0000 mL | Freq: Once | INTRAVENOUS | Status: AC | PRN

## 2016-07-22 ENCOUNTER — Telehealth: Payer: Self-pay | Admitting: *Deleted

## 2016-07-22 NOTE — Telephone Encounter (Signed)
LVM requesting call back re: results. 

## 2016-07-23 ENCOUNTER — Telehealth: Payer: Self-pay

## 2016-07-23 ENCOUNTER — Ambulatory Visit: Payer: Self-pay | Admitting: Neurology

## 2016-07-23 NOTE — Telephone Encounter (Signed)
Patient called office and has been notified of MRI results being normal per RN.  Patient voiced his understanding.

## 2016-07-23 NOTE — Telephone Encounter (Signed)
Called pt w/ normal MRI results. May call back w/ additional questions/concerns.

## 2016-08-12 ENCOUNTER — Ambulatory Visit: Payer: BLUE CROSS/BLUE SHIELD | Admitting: Neurology

## 2016-08-12 ENCOUNTER — Telehealth: Payer: Self-pay

## 2016-08-12 NOTE — Telephone Encounter (Signed)
Left VM mssg for pt to call later this week to r/s appt.

## 2016-08-14 DIAGNOSIS — Z6834 Body mass index (BMI) 34.0-34.9, adult: Secondary | ICD-10-CM | POA: Diagnosis not present

## 2016-08-14 DIAGNOSIS — I10 Essential (primary) hypertension: Secondary | ICD-10-CM | POA: Diagnosis not present

## 2016-08-14 DIAGNOSIS — G40209 Localization-related (focal) (partial) symptomatic epilepsy and epileptic syndromes with complex partial seizures, not intractable, without status epilepticus: Secondary | ICD-10-CM | POA: Diagnosis not present

## 2016-08-20 ENCOUNTER — Telehealth: Payer: Self-pay

## 2016-08-20 ENCOUNTER — Ambulatory Visit: Payer: Self-pay | Admitting: Neurology

## 2016-08-20 NOTE — Telephone Encounter (Signed)
Pt no-showed his appt this afternoon.

## 2016-08-21 ENCOUNTER — Encounter: Payer: Self-pay | Admitting: Neurology

## 2016-09-03 ENCOUNTER — Ambulatory Visit: Payer: BLUE CROSS/BLUE SHIELD | Admitting: Neurology

## 2016-10-14 ENCOUNTER — Encounter: Payer: Self-pay | Admitting: Neurology

## 2016-10-14 ENCOUNTER — Ambulatory Visit (INDEPENDENT_AMBULATORY_CARE_PROVIDER_SITE_OTHER): Payer: BLUE CROSS/BLUE SHIELD | Admitting: Neurology

## 2016-10-14 VITALS — BP 129/81 | HR 88 | Ht 74.0 in | Wt 274.8 lb

## 2016-10-14 DIAGNOSIS — G40009 Localization-related (focal) (partial) idiopathic epilepsy and epileptic syndromes with seizures of localized onset, not intractable, without status epilepticus: Secondary | ICD-10-CM | POA: Diagnosis not present

## 2016-10-14 DIAGNOSIS — G40209 Localization-related (focal) (partial) symptomatic epilepsy and epileptic syndromes with complex partial seizures, not intractable, without status epilepticus: Secondary | ICD-10-CM | POA: Diagnosis not present

## 2016-10-14 DIAGNOSIS — Z13220 Encounter for screening for lipoid disorders: Secondary | ICD-10-CM | POA: Diagnosis not present

## 2016-10-14 DIAGNOSIS — I10 Essential (primary) hypertension: Secondary | ICD-10-CM | POA: Diagnosis not present

## 2016-10-14 MED ORDER — LAMOTRIGINE 150 MG PO TABS: 150.0000 mg | ORAL_TABLET | Freq: Two times a day (BID) | ORAL | 6 refills | Status: DC

## 2016-10-14 NOTE — Progress Notes (Signed)
[facility]    Provider:  Dr Lucia Gaskins Referring Provider: Lewis Moccasin, MD Primary Care Physician:  Lewis Moccasin, MD   Interval history 10/14/2016: MRi brain normal, reviewed images with patient today. He is doing well on Lamictal, no more seizures. Discussed medications   VWU:[id number] Barrettis a 46 y.o.malehere for seizures. He previously saw my colleague Dr. Elspeth Cho and is here today to see me after having another seizure. He has been on Lamictal, last seizure documented was in September 2015 after missing 2 doses of his AED.  Patient was doing well since 2015, no more events, no missing medications, he was driving and his vision started "closing", coworker was in the car. This was quicker, but similar to his previous seizures, he felt his vision closing in on him, his foot was on the gas and coworker had to steer the wheel and he was stiff and he was sore afterwards. He bit his jaw. No urination. He remembers waking up behind the wheel and then he remembers being in the ambulance. He was "in and out" sleepy and weak. He has had a full workup, multiple EEGs and MRIs without etiology but this was many years ago in 2011 or earlier. He has had multiple head trauma and that is why they think he has seizures. This last seizure was during high stress and sleep deprivation. Wife is here and provides information. Also coworker who was in the car provided information, she heard him take a big breath, he started to stiffen up, total stiffness and he could not be moved, eyes open, lasted 3 minurs, mouth foamed, they called 911. He was beligerant afterwards. No recent illnesses but working hard with sleep deprivation.  I reviewed previous notes for my colleague Elspeth Cho below. I also reviewed discharge documents from recent hospitalization for break through seizure. CMP showed elevated glucose 155, CO2 16, anion gap 17 creatinine 1.61 otherwise unremarkable, CBC showed  white blood cells 13 otherwise unremarkable. Reviewed CT of the chest report which showed negative CT scan of the chest with IV contrast material, CT of the abdomen and pelvis showed no acute abdominal or pelvic findings, CT of the thoracic and lumbar spine showed mild S-shaped curvature of the thoracolumbar spine, moderate to severe L5-S1 degenerative disc narrowing, CT scan of the cervical spine showed no evidence of cervical spine fracture, mild right convex curvature of the mid cervical spine mild degenerative disc narrowing at C4-C5 and C5-C6. CT of the head was negative without contrast material, normal.  CT head 12/2013 showed No acute intracranial abnormalities including mass lesion or mass effect, hydrocephalus, extra-axial fluid collection, midline shift, hemorrhage, or acute infarction, large ischemic events (personally reviewed images)  Lamictal level 2.3. Repeat creatinine 1.02 October 30th 2017,   Previous notes:  Danne Harbor, MD 2015: Initial seizure was in 2002. During episodes he has LOC, he gets very stiff and tense and then has generalized shaking for 1 to 2 minutes. No loss of bowel/bladder. Did bite his tongue with this most recent event. Notes severe pain and discomfort related to this. Has had 3 events total. 2nd event was related to trying to change his medication, 3rd event related to missing 2 doses of his lamictal. Has had MRI and EEG in the past, recently had head CT. Reports all tests were unremarkable. Unclear etiology of events. Confused post ictal. Initially was started on dilantin and then switched to lamictal. Tolerating it well.   Notes severe concussion in high school  with LOC. Social EtOH, no drug use. No recent fevers, illnesses. No family history of seizures. He is otherwise healthy.  ED notes: 46 year old male presents with a seizure that occurred prior to arrival. He is on Lamictal and thinks he may have missed the last one to 2 doses. He's been on this for  several years. His last seizure was in 2002. Back then he had 2 seizures within a couple weeks. He recently got over an upper respiratory infection. He has no more symptoms of this. Denies any recent fevers. No headaches, vomiting, or diarrhea. He fell and hit the back of his head during a seizure. He is not currently follow with a neurologist. He states he feels little hazy but has been improving the further out from the seizure he is gone. He bit his tongue on the right side during the seizure  Review of Systems: Patient complains of symptoms per HPI as well as the following symptoms: No rash, no fever, no CP, no SOB. Pertinent negatives and positives per HPI. All others negative.   Social History   Social History  . Marital status: Married    Spouse name: Alyce  . Number of children: 2  . Years of education: Grad Sch   Occupational History  .  [employer]   Social History Main Topics  . Smoking status: Never Smoker  . Smokeless tobacco: Never Used  . Alcohol use Yes     Comment: 2-3 drinks per week  . Drug use: No  . Sexual activity: Not on file   Other Topics Concern  . Not on file   Social History Narrative   Patient lives at home with his spouse and daughter.   Caffeine Use: 2 cups daily    Family History  Problem Relation Age of Onset  . High blood pressure Mother   . Diabetes Mother   . Heart failure Mother   . Lung cancer Father   . Seizures Neg Hx     Past Medical History:  Diagnosis Date  . Seizures (HCC) 2003    Past Surgical History:  Procedure Laterality Date  . KNEE ARTHROSCOPY Right     Current Outpatient Prescriptions  Medication Sig Dispense Refill  . lamoTRIgine (LAMICTAL) 150 MG tablet   3  . lisinopril (PRINIVIL,ZESTRIL) 40 MG tablet Take 40 mg by mouth daily.  3  . VIAGRA 100 MG tablet   8   No current facility-administered medications for this visit.    Facility-Administered Medications Ordered in Other Visits   Medication Dose Route Frequency Provider Last Rate Last Dose  . gadopentetate dimeglumine (MAGNEVIST) injection 20 mL  20 mL Intravenous Once PRN Anson FretAhern, Jonuel Butterfield B, MD        Allergies as of 10/14/2016  . (No Known Allergies)    Vitals: BP 129/81   Pulse 88   Ht 6\' 2"  (1.88 m)   Wt 274 lb 12.8 oz (124.6 kg)   BMI 35.28 kg/m  Last Weight:  Wt Readings from Last 1 Encounters:  10/14/16 274 lb 12.8 oz (124.6 kg)   Last Height:   Ht Readings from Last 1 Encounters:  10/14/16 6\' 2"  (1.88 m)   Physical exam: Exam: Gen: NAD, conversant, well nourised, well groomed                     CV: RRR, no MRG. No Carotid Bruits. No peripheral edema, warm, nontender Eyes: Conjunctivae clear without exudates or  hemorrhage  Neuro: Detailed Neurologic Exam  Speech:    Speech is normal; fluent and spontaneous with normal comprehension.  Cognition:    The patient is oriented to person, place, and time;     recent and remote memory intact;     language fluent;     normal attention, concentration,     fund of knowledge Cranial Nerves:    The pupils are equal, round, and reactive to light. The fundi are normal and spontaneous venous pulsations are present. Visual fields are full to finger confrontation. Extraocular movements are intact. Trigeminal sensation is intact and the muscles of mastication are normal. The face is symmetric. The palate elevates in the midline. Hearing intact. Voice is normal. Shoulder shrug is normal. The tongue has normal motion without fasciculations.   Coordination:    Normal finger to nose and heel to shin. Normal rapid alternating movements.   Gait:    Heel-toe and tandem gait are normal.   Motor Observation:    No asymmetry, no atrophy, and no involuntary movements noted. Tone:    Normal muscle tone.    Posture:    Posture is normal. normal erect    Strength:    Strength is V/V in the upper and lower limbs.      Sensation: intact to LT     Reflex  Exam:  DTR's:    Deep tendon reflexes in the upper and lower extremities are normal bilaterally.   Toes:    The toes are downgoing bilaterally.   Clonus:    Clonus is absent.      Assessment/Plan:  46y/o gentleman with history of seizure disorder presenting for evaluation of breakthrough seizure. Recent event may be unprovoked, will increase his lamictal. Unclear etiology of his seizures, notes having had an unremarkable EEG and MRI in the past many years ago, feel repeating MRI of the brain is warranted to evaluate for lesions that may not have been seen in the initial MRI.  Seizures stable Snoring: has had sleep eval, monitor for apneic events untreated OSA can increase risk of stroke and other sequelae Obesity: recommend [facility] Dr[facility] Quillian Quince here at [facility] Repeat MRI of the brain w/wo contrast normal Increased Lamictal 150mg  twice daily watch for rash, discussed side effects as per patinet instructions Refill Lamictal  Discussed Patients with epilepsy have a small risk of sudden unexpected death, a condition referred to as sudden unexpected death in epilepsy (SUDEP). SUDEP is defined specifically as the sudden, unexpected, witnessed or unwitnessed, nontraumatic and nondrowning death in patients with epilepsy with or without evidence for a seizure, and excluding documented status epilepticus, in which post mortem examination does not reveal a structural or toxicologic cause for death   Cc: Dr. Maylene Roes, MD  [facility] [address] [place], [place] [address]  Phone [phone] Fax [phone]  A total of 25 minutes was spent face-to-face with this patient. Over half this time was spent on counseling patient on the epilepsy diagnosis and different diagnostic and therapeutic options available.

## 2016-10-16 DIAGNOSIS — Z6834 Body mass index (BMI) 34.0-34.9, adult: Secondary | ICD-10-CM | POA: Diagnosis not present

## 2016-10-16 DIAGNOSIS — G40209 Localization-related (focal) (partial) symptomatic epilepsy and epileptic syndromes with complex partial seizures, not intractable, without status epilepticus: Secondary | ICD-10-CM | POA: Diagnosis not present

## 2016-10-16 DIAGNOSIS — I10 Essential (primary) hypertension: Secondary | ICD-10-CM | POA: Diagnosis not present

## 2016-10-16 DIAGNOSIS — E782 Mixed hyperlipidemia: Secondary | ICD-10-CM | POA: Diagnosis not present

## 2016-12-26 ENCOUNTER — Encounter (INDEPENDENT_AMBULATORY_CARE_PROVIDER_SITE_OTHER): Payer: BLUE CROSS/BLUE SHIELD

## 2017-01-02 ENCOUNTER — Encounter (INDEPENDENT_AMBULATORY_CARE_PROVIDER_SITE_OTHER): Payer: Self-pay

## 2017-01-02 ENCOUNTER — Ambulatory Visit (INDEPENDENT_AMBULATORY_CARE_PROVIDER_SITE_OTHER): Payer: BLUE CROSS/BLUE SHIELD | Admitting: Family Medicine

## 2017-01-22 DIAGNOSIS — J018 Other acute sinusitis: Secondary | ICD-10-CM | POA: Diagnosis not present

## 2017-02-17 DIAGNOSIS — Z23 Encounter for immunization: Secondary | ICD-10-CM | POA: Diagnosis not present

## 2017-03-04 DIAGNOSIS — G501 Atypical facial pain: Secondary | ICD-10-CM | POA: Diagnosis not present

## 2017-04-23 DIAGNOSIS — Z13220 Encounter for screening for lipoid disorders: Secondary | ICD-10-CM | POA: Diagnosis not present

## 2017-04-23 DIAGNOSIS — Z114 Encounter for screening for human immunodeficiency virus [HIV]: Secondary | ICD-10-CM | POA: Diagnosis not present

## 2017-04-23 DIAGNOSIS — Z0000 Encounter for general adult medical examination without abnormal findings: Secondary | ICD-10-CM | POA: Diagnosis not present

## 2017-04-23 DIAGNOSIS — Z1329 Encounter for screening for other suspected endocrine disorder: Secondary | ICD-10-CM | POA: Diagnosis not present

## 2017-04-23 DIAGNOSIS — Z125 Encounter for screening for malignant neoplasm of prostate: Secondary | ICD-10-CM | POA: Diagnosis not present

## 2017-04-25 DIAGNOSIS — I10 Essential (primary) hypertension: Secondary | ICD-10-CM | POA: Diagnosis not present

## 2017-04-25 DIAGNOSIS — Z0000 Encounter for general adult medical examination without abnormal findings: Secondary | ICD-10-CM | POA: Diagnosis not present

## 2017-04-25 DIAGNOSIS — Z6834 Body mass index (BMI) 34.0-34.9, adult: Secondary | ICD-10-CM | POA: Diagnosis not present

## 2017-04-25 DIAGNOSIS — G40209 Localization-related (focal) (partial) symptomatic epilepsy and epileptic syndromes with complex partial seizures, not intractable, without status epilepticus: Secondary | ICD-10-CM | POA: Diagnosis not present

## 2017-04-25 DIAGNOSIS — E782 Mixed hyperlipidemia: Secondary | ICD-10-CM | POA: Diagnosis not present

## 2017-06-24 DIAGNOSIS — E291 Testicular hypofunction: Secondary | ICD-10-CM | POA: Diagnosis not present

## 2017-06-24 DIAGNOSIS — R635 Abnormal weight gain: Secondary | ICD-10-CM | POA: Diagnosis not present

## 2017-06-27 DIAGNOSIS — E291 Testicular hypofunction: Secondary | ICD-10-CM | POA: Diagnosis not present

## 2017-06-27 DIAGNOSIS — I10 Essential (primary) hypertension: Secondary | ICD-10-CM | POA: Diagnosis not present

## 2017-06-27 DIAGNOSIS — G479 Sleep disorder, unspecified: Secondary | ICD-10-CM | POA: Diagnosis not present

## 2017-06-27 DIAGNOSIS — R7303 Prediabetes: Secondary | ICD-10-CM | POA: Diagnosis not present

## 2017-06-27 DIAGNOSIS — R5383 Other fatigue: Secondary | ICD-10-CM | POA: Diagnosis not present

## 2017-06-27 DIAGNOSIS — R6882 Decreased libido: Secondary | ICD-10-CM | POA: Diagnosis not present

## 2017-06-27 DIAGNOSIS — Z1331 Encounter for screening for depression: Secondary | ICD-10-CM | POA: Diagnosis not present

## 2017-07-04 DIAGNOSIS — R7303 Prediabetes: Secondary | ICD-10-CM | POA: Diagnosis not present

## 2017-07-04 DIAGNOSIS — I10 Essential (primary) hypertension: Secondary | ICD-10-CM | POA: Diagnosis not present

## 2017-07-11 DIAGNOSIS — R7303 Prediabetes: Secondary | ICD-10-CM | POA: Diagnosis not present

## 2017-07-11 DIAGNOSIS — I10 Essential (primary) hypertension: Secondary | ICD-10-CM | POA: Diagnosis not present

## 2017-07-21 DIAGNOSIS — E782 Mixed hyperlipidemia: Secondary | ICD-10-CM | POA: Diagnosis not present

## 2017-07-21 DIAGNOSIS — Z131 Encounter for screening for diabetes mellitus: Secondary | ICD-10-CM | POA: Diagnosis not present

## 2017-07-22 DIAGNOSIS — E559 Vitamin D deficiency, unspecified: Secondary | ICD-10-CM | POA: Diagnosis not present

## 2017-07-22 DIAGNOSIS — I10 Essential (primary) hypertension: Secondary | ICD-10-CM | POA: Diagnosis not present

## 2017-07-22 DIAGNOSIS — E291 Testicular hypofunction: Secondary | ICD-10-CM | POA: Diagnosis not present

## 2017-07-22 DIAGNOSIS — R7303 Prediabetes: Secondary | ICD-10-CM | POA: Diagnosis not present

## 2017-07-23 ENCOUNTER — Ambulatory Visit
Admission: RE | Admit: 2017-07-23 | Discharge: 2017-07-23 | Disposition: A | Discharge status: Home / Self Care | Payer: BLUE CROSS/BLUE SHIELD | Source: Ambulatory Visit | Attending: Family Medicine | Admitting: Family Medicine

## 2017-07-23 ENCOUNTER — Other Ambulatory Visit: Payer: Self-pay | Admitting: Family Medicine

## 2017-07-23 DIAGNOSIS — M7989 Other specified soft tissue disorders: Secondary | ICD-10-CM

## 2017-07-23 DIAGNOSIS — W19XXXA Unspecified fall, initial encounter: Secondary | ICD-10-CM

## 2017-07-23 DIAGNOSIS — G40209 Localization-related (focal) (partial) symptomatic epilepsy and epileptic syndromes with complex partial seizures, not intractable, without status epilepticus: Secondary | ICD-10-CM | POA: Diagnosis not present

## 2017-07-23 DIAGNOSIS — E782 Mixed hyperlipidemia: Secondary | ICD-10-CM | POA: Diagnosis not present

## 2017-07-23 DIAGNOSIS — M79644 Pain in right finger(s): Secondary | ICD-10-CM | POA: Diagnosis not present

## 2017-07-23 DIAGNOSIS — S62521A Displaced fracture of distal phalanx of right thumb, initial encounter for closed fracture: Secondary | ICD-10-CM | POA: Diagnosis not present

## 2017-07-23 DIAGNOSIS — I10 Essential (primary) hypertension: Secondary | ICD-10-CM | POA: Diagnosis not present

## 2017-07-23 DIAGNOSIS — Z6833 Body mass index (BMI) 33.0-33.9, adult: Secondary | ICD-10-CM | POA: Diagnosis not present

## 2017-08-01 DIAGNOSIS — I10 Essential (primary) hypertension: Secondary | ICD-10-CM | POA: Diagnosis not present

## 2017-08-01 DIAGNOSIS — R7303 Prediabetes: Secondary | ICD-10-CM | POA: Diagnosis not present

## 2017-08-08 DIAGNOSIS — R6882 Decreased libido: Secondary | ICD-10-CM | POA: Diagnosis not present

## 2017-08-08 DIAGNOSIS — G479 Sleep disorder, unspecified: Secondary | ICD-10-CM | POA: Diagnosis not present

## 2017-08-08 DIAGNOSIS — R7303 Prediabetes: Secondary | ICD-10-CM | POA: Diagnosis not present

## 2017-08-08 DIAGNOSIS — R5383 Other fatigue: Secondary | ICD-10-CM | POA: Diagnosis not present

## 2017-08-08 DIAGNOSIS — E291 Testicular hypofunction: Secondary | ICD-10-CM | POA: Diagnosis not present

## 2017-08-08 DIAGNOSIS — I10 Essential (primary) hypertension: Secondary | ICD-10-CM | POA: Diagnosis not present

## 2017-08-29 DIAGNOSIS — E291 Testicular hypofunction: Secondary | ICD-10-CM | POA: Diagnosis not present

## 2017-08-29 DIAGNOSIS — I10 Essential (primary) hypertension: Secondary | ICD-10-CM | POA: Diagnosis not present

## 2017-09-05 DIAGNOSIS — I10 Essential (primary) hypertension: Secondary | ICD-10-CM | POA: Diagnosis not present

## 2017-09-11 DIAGNOSIS — I10 Essential (primary) hypertension: Secondary | ICD-10-CM | POA: Diagnosis not present

## 2017-09-11 DIAGNOSIS — E669 Obesity, unspecified: Secondary | ICD-10-CM | POA: Diagnosis not present

## 2017-09-11 DIAGNOSIS — E291 Testicular hypofunction: Secondary | ICD-10-CM | POA: Diagnosis not present

## 2017-09-11 DIAGNOSIS — R7303 Prediabetes: Secondary | ICD-10-CM | POA: Diagnosis not present

## 2017-09-25 DIAGNOSIS — I10 Essential (primary) hypertension: Secondary | ICD-10-CM | POA: Diagnosis not present

## 2017-09-25 DIAGNOSIS — R7303 Prediabetes: Secondary | ICD-10-CM | POA: Diagnosis not present

## 2017-10-08 DIAGNOSIS — I10 Essential (primary) hypertension: Secondary | ICD-10-CM | POA: Diagnosis not present

## 2017-10-08 DIAGNOSIS — R7303 Prediabetes: Secondary | ICD-10-CM | POA: Diagnosis not present

## 2017-11-28 ENCOUNTER — Other Ambulatory Visit: Payer: Self-pay | Admitting: Neurology

## 2017-12-26 ENCOUNTER — Other Ambulatory Visit: Payer: Self-pay | Admitting: Neurology

## 2018-01-01 DIAGNOSIS — Z6832 Body mass index (BMI) 32.0-32.9, adult: Secondary | ICD-10-CM | POA: Diagnosis not present

## 2018-01-01 DIAGNOSIS — G40209 Localization-related (focal) (partial) symptomatic epilepsy and epileptic syndromes with complex partial seizures, not intractable, without status epilepticus: Secondary | ICD-10-CM | POA: Diagnosis not present

## 2018-01-01 DIAGNOSIS — I10 Essential (primary) hypertension: Secondary | ICD-10-CM | POA: Diagnosis not present

## 2018-01-01 DIAGNOSIS — Z23 Encounter for immunization: Secondary | ICD-10-CM | POA: Diagnosis not present

## 2018-02-10 DIAGNOSIS — Z125 Encounter for screening for malignant neoplasm of prostate: Secondary | ICD-10-CM | POA: Diagnosis not present

## 2018-02-10 DIAGNOSIS — Z1329 Encounter for screening for other suspected endocrine disorder: Secondary | ICD-10-CM | POA: Diagnosis not present

## 2018-02-10 DIAGNOSIS — Z0000 Encounter for general adult medical examination without abnormal findings: Secondary | ICD-10-CM | POA: Diagnosis not present

## 2018-02-10 DIAGNOSIS — I10 Essential (primary) hypertension: Secondary | ICD-10-CM | POA: Diagnosis not present

## 2018-02-10 DIAGNOSIS — Z13220 Encounter for screening for lipoid disorders: Secondary | ICD-10-CM | POA: Diagnosis not present

## 2018-02-10 DIAGNOSIS — Z114 Encounter for screening for human immunodeficiency virus [HIV]: Secondary | ICD-10-CM | POA: Diagnosis not present

## 2018-02-13 DIAGNOSIS — Z0000 Encounter for general adult medical examination without abnormal findings: Secondary | ICD-10-CM | POA: Diagnosis not present

## 2018-02-13 DIAGNOSIS — Z6832 Body mass index (BMI) 32.0-32.9, adult: Secondary | ICD-10-CM | POA: Diagnosis not present

## 2018-08-18 DIAGNOSIS — E782 Mixed hyperlipidemia: Secondary | ICD-10-CM | POA: Diagnosis not present

## 2018-08-18 DIAGNOSIS — J302 Other seasonal allergic rhinitis: Secondary | ICD-10-CM | POA: Diagnosis not present

## 2018-08-18 DIAGNOSIS — I10 Essential (primary) hypertension: Secondary | ICD-10-CM | POA: Diagnosis not present

## 2018-08-18 DIAGNOSIS — E785 Hyperlipidemia, unspecified: Secondary | ICD-10-CM | POA: Diagnosis not present

## 2018-11-18 DIAGNOSIS — J029 Acute pharyngitis, unspecified: Secondary | ICD-10-CM | POA: Diagnosis not present

## 2018-11-18 DIAGNOSIS — H6691 Otitis media, unspecified, right ear: Secondary | ICD-10-CM | POA: Diagnosis not present

## 2018-11-18 DIAGNOSIS — Z20828 Contact with and (suspected) exposure to other viral communicable diseases: Secondary | ICD-10-CM | POA: Diagnosis not present

## 2018-11-19 ENCOUNTER — Other Ambulatory Visit: Payer: Self-pay

## 2018-11-19 DIAGNOSIS — Z20822 Contact with and (suspected) exposure to covid-19: Secondary | ICD-10-CM

## 2018-11-22 LAB — NOVEL CORONAVIRUS, NAA: SARS-CoV-2, NAA: NOT DETECTED

## 2018-12-08 DIAGNOSIS — E782 Mixed hyperlipidemia: Secondary | ICD-10-CM | POA: Diagnosis not present

## 2018-12-08 DIAGNOSIS — R739 Hyperglycemia, unspecified: Secondary | ICD-10-CM | POA: Diagnosis not present

## 2018-12-08 DIAGNOSIS — I10 Essential (primary) hypertension: Secondary | ICD-10-CM | POA: Diagnosis not present

## 2018-12-11 DIAGNOSIS — J302 Other seasonal allergic rhinitis: Secondary | ICD-10-CM | POA: Diagnosis not present

## 2018-12-11 DIAGNOSIS — I10 Essential (primary) hypertension: Secondary | ICD-10-CM | POA: Diagnosis not present

## 2018-12-11 DIAGNOSIS — E782 Mixed hyperlipidemia: Secondary | ICD-10-CM | POA: Diagnosis not present

## 2018-12-28 ENCOUNTER — Encounter (HOSPITAL_COMMUNITY): Payer: Self-pay

## 2018-12-28 ENCOUNTER — Emergency Department (HOSPITAL_COMMUNITY)
Admission: EM | Admit: 2018-12-28 | Discharge: 2018-12-28 | Disposition: A | Discharge status: Home / Self Care | Payer: BC Managed Care – PPO | Attending: Emergency Medicine | Admitting: Emergency Medicine

## 2018-12-28 ENCOUNTER — Other Ambulatory Visit: Payer: Self-pay

## 2018-12-28 DIAGNOSIS — R410 Disorientation, unspecified: Secondary | ICD-10-CM | POA: Diagnosis not present

## 2018-12-28 DIAGNOSIS — I10 Essential (primary) hypertension: Secondary | ICD-10-CM | POA: Diagnosis not present

## 2018-12-28 DIAGNOSIS — R569 Unspecified convulsions: Secondary | ICD-10-CM | POA: Diagnosis not present

## 2018-12-28 DIAGNOSIS — R000 Tachycardia, unspecified: Secondary | ICD-10-CM | POA: Diagnosis not present

## 2018-12-28 DIAGNOSIS — Z79899 Other long term (current) drug therapy: Secondary | ICD-10-CM | POA: Diagnosis not present

## 2018-12-28 DIAGNOSIS — G40909 Epilepsy, unspecified, not intractable, without status epilepticus: Secondary | ICD-10-CM | POA: Diagnosis not present

## 2018-12-28 LAB — CBC WITH DIFFERENTIAL/PLATELET
Abs Immature Granulocytes: 0.1 10*3/uL — ABNORMAL HIGH (ref 0.00–0.07)
Basophils Absolute: 0 10*3/uL (ref 0.0–0.1)
Basophils Relative: 0 %
Eosinophils Absolute: 0.1 10*3/uL (ref 0.0–0.5)
Eosinophils Relative: 1 %
HCT: 42.4 % (ref 39.0–52.0)
Hemoglobin: 14.4 g/dL (ref 13.0–17.0)
Immature Granulocytes: 1 %
Lymphocytes Relative: 15 %
Lymphs Abs: 1.9 10*3/uL (ref 0.7–4.0)
MCH: 29.6 pg (ref 26.0–34.0)
MCHC: 34 g/dL (ref 30.0–36.0)
MCV: 87.2 fL (ref 80.0–100.0)
Monocytes Absolute: 0.7 10*3/uL (ref 0.1–1.0)
Monocytes Relative: 6 %
Neutro Abs: 9.5 10*3/uL — ABNORMAL HIGH (ref 1.7–7.7)
Neutrophils Relative %: 77 %
Platelets: 250 10*3/uL (ref 150–400)
RBC: 4.86 MIL/uL (ref 4.22–5.81)
RDW: 13.2 % (ref 11.5–15.5)
WBC: 12.3 10*3/uL — ABNORMAL HIGH (ref 4.0–10.5)
nRBC: 0 % (ref 0.0–0.2)

## 2018-12-28 LAB — BASIC METABOLIC PANEL
Anion gap: 11 (ref 5–15)
BUN: 19 mg/dL (ref 6–20)
CO2: 24 mmol/L (ref 22–32)
Calcium: 9.3 mg/dL (ref 8.9–10.3)
Chloride: 100 mmol/L (ref 98–111)
Creatinine, Ser: 1.6 mg/dL — ABNORMAL HIGH (ref 0.61–1.24)
GFR calc Af Amer: 59 mL/min — ABNORMAL LOW (ref >60)
GFR calc non Af Amer: 51 mL/min — ABNORMAL LOW (ref >60)
Glucose, Bld: 120 mg/dL — ABNORMAL HIGH (ref 70–99)
Potassium: 3.9 mmol/L (ref 3.5–5.1)
Sodium: 135 mmol/L (ref 135–145)

## 2018-12-28 MED ORDER — SODIUM CHLORIDE 0.9 % IV BOLUS
1000.0000 mL | Freq: Once | INTRAVENOUS | Status: AC
  Administered 2018-12-28: 12:00:00 1000 mL via INTRAVENOUS

## 2018-12-28 NOTE — ED Triage Notes (Signed)
Pt arrives EMS from home with c/o seizure at home while sitting on couch. No fall. Pt had full body seizure lasting about 1 minute per wife. Hx of seizure and wife states this was longer and more violent than his norm. Pt takes lamictal and no changes to dose or missed doses.

## 2018-12-28 NOTE — ED Notes (Signed)
Taking PO fluids and tolerating well

## 2018-12-28 NOTE — ED Provider Notes (Signed)
[facility] EMERGENCY DEPARTMENT Provider Note   CSN: [id number] Arrival date & time: 12/28/18  1033     History   Chief Complaint Chief Complaint  Patient presents with  . Seizures    HPI Marc Hayden is a 48 y.o. male.  Presents emergency department after seizure episode.  Witnessed, sitting on the couch at home with his wife, states that he prior to seizure felt like he had tunnel vision and then went unresponsive.  Wife describes tonic-clonic, upper and lower extremity, lasted for a few minutes, resolved spontaneously.  Had post ictal.  Approximately 10 minutes long, full return to baseline, no subsequent seizures since that time.  Patient has been on Lamictal 150 mg twice daily since 2018 and has not had any changes in this medicine recently.  Follows with neurologist for this.  Denies any numbness, weakness, vision changes, chest pain or difficulty breathing.  No bladder or bowel incontinence.  No recent illnesses.     HPI  Past Medical History:  Diagnosis Date  . Seizures (HCC) 2003    Patient Active Problem List   Diagnosis Date Noted  . Localization-related idiopathic epilepsy and epileptic syndromes with seizures of localized onset, not intractable, without status epilepticus (HCC) 02/29/2016  . Convulsions/seizures (HCC) 01/24/2014    Past Surgical History:  Procedure Laterality Date  . KNEE ARTHROSCOPY Right         Home Medications    Prior to Admission medications   Medication Sig Start Date End Date Taking? Authorizing Provider  diphenhydrAMINE (BENADRYL) 25 MG tablet Take 25 mg by mouth every 6 (six) hours as needed for itching or allergies.   Yes [provider]  hydrochlorothiazide (HYDRODIURIL) 25 MG tablet Take 25 mg by mouth daily. 12/06/18  Yes [provider]  lamoTRIgine (LAMICTAL) 150 MG tablet TAKE 1 TABLET(150 MG) BY MOUTH TWICE DAILY Patient taking differently: Take 150 mg by mouth 2 (two) times daily.   11/28/17  Yes Anson Fret, MD  lisinopril (PRINIVIL,ZESTRIL) 40 MG tablet Take 40 mg by mouth daily. 02/02/16  Yes [provider]  loratadine (CLARITIN) 10 MG tablet Take 10 mg by mouth daily.   Yes [provider]  rosuvastatin (CRESTOR) 5 MG tablet Take 5 mg by mouth daily. 12/11/18  Yes [provider]    Family History Family History  Problem Relation Age of Onset  . High blood pressure Mother   . Diabetes Mother   . Heart failure Mother   . Lung cancer Father   . Seizures Neg Hx     Social History Social History   Tobacco Use  . Smoking status: Never Smoker  . Smokeless tobacco: Never Used  Substance Use Topics  . Alcohol use: Yes    Comment: 2-3 drinks per week  . Drug use: No     Allergies   Patient has no known allergies.   Review of Systems Review of Systems  Constitutional: Negative for chills and fever.  HENT: Negative for ear pain and sore throat.   Eyes: Negative for pain and visual disturbance.  Respiratory: Negative for cough and shortness of breath.   Cardiovascular: Negative for chest pain and palpitations.  Gastrointestinal: Negative for abdominal pain and vomiting.  Genitourinary: Negative for dysuria and hematuria.  Musculoskeletal: Negative for arthralgias and back pain.  Skin: Negative for color change and rash.  Neurological: Positive for seizures. Negative for syncope.  All other systems reviewed and are negative.    Physical Exam  Updated Vital Signs Ht 6\' 2"  (1.88 m)   Wt 118.8 kg   SpO2 92% Comment: ra  BMI 33.64 kg/m   Physical Exam Vitals signs and nursing note reviewed.  Constitutional:      Appearance: He is well-developed.  HENT:     Head: Normocephalic and atraumatic.  Eyes:     Conjunctiva/sclera: Conjunctivae normal.  Neck:     Musculoskeletal: Neck supple.  Cardiovascular:     Rate and Rhythm: Normal rate and regular rhythm.     Heart sounds: No murmur.  Pulmonary:     Effort:  Pulmonary effort is normal. No respiratory distress.     Breath sounds: Normal breath sounds.  Abdominal:     Palpations: Abdomen is soft.     Tenderness: There is no abdominal tenderness.  Musculoskeletal:        General: No swelling or tenderness.  Skin:    General: Skin is warm and dry.     Capillary Refill: Capillary refill takes less than 2 seconds.  Neurological:     Mental Status: He is alert.     Comments: Alert and oriented x3, cranial nerves II through XII intact, 5 out of 5 strength in bilateral upper and lower extremities, no pronator drift, normal finger-nose-finger, sensation intact all 4 extremities      ED Treatments / Results  Labs (all labs ordered are listed, but only abnormal results are displayed) Labs Reviewed  CBC WITH DIFFERENTIAL/PLATELET - Abnormal; Notable for the following components:      Result Value   WBC 12.3 (*)    Neutro Abs 9.5 (*)    Abs Immature Granulocytes 0.10 (*)    All other components within normal limits  BASIC METABOLIC PANEL - Abnormal; Notable for the following components:   Glucose, Bld 120 (*)    Creatinine, Ser 1.60 (*)    GFR calc non Af Amer 51 (*)    GFR calc Af Amer 59 (*)    All other components within normal limits    EKG EKG Interpretation  Date/Time:  Monday December 28 2018 10:34:57 EDT Ventricular Rate:  103 PR Interval:    QRS Duration: 102 QT Interval:  333 QTC Calculation: 436 R Axis:   88 Text Interpretation:  Sinus tachycardia Nonspecific repol abnormality, inferior leads Borderline ST elevation, lateral leads no STEMI Confirmed by Marianna Fussykstra, Lulu Hirschmann ([id number]) on 12/28/2018 11:48:45 AM   Radiology No results found.  Procedures Procedures (including critical care time)  Medications Ordered in ED Medications  sodium chloride 0.9 % bolus 1,000 mL (1,000 mLs Intravenous New Bag/Given 12/28/18 1134)     Initial Impression / Assessment and Plan / ED Course  I have reviewed the triage vital signs and the  nursing notes.  Pertinent labs & imaging results that were available during my care of the patient were reviewed by me and considered in my medical decision making (see chart for details).        48 year old male with known seizure disorder presents after 1 seizure episode earlier today.  Has been almost 2 years since last seizure, had MRI 2 years ago that was normal.  Here patient was well-appearing, no further seizure episodes, normal neurologic exam no ongoing neurologic symptoms.  At this time I believe he is appropriate for further management as outpatient.  Recommended close recheck with his neurologist and seizure precautions.  Given known diagnosis, no ongoing symptoms, one episode, similar to prior, do not feel emergent head imaging needed.  Given only  one episode in almost 2 years, do not feel needs acute change in antiepileptic therapy.  Obviously though will need further discussion by neurologist believe this can be performed on patient basis.    After the discussed management above, the patient was determined to be safe for discharge.  The patient was in agreement with this plan and all questions regarding their care were answered.  ED return precautions were discussed and the patient will return to the ED with any significant worsening of condition.    Final Clinical Impressions(s) / ED Diagnoses   Final diagnoses:  Seizure disorder [facility])  Seizure [facility])    ED Discharge Orders    None       Lucrezia Starch, MD 12/28/18 1252

## 2018-12-28 NOTE — ED Notes (Signed)
DC instructions discussed with pt and wife. Understanding voiced. Pt home stable with steady gait.

## 2018-12-28 NOTE — Discharge Instructions (Addendum)
Please call your neurologist to schedule close follow-up appointment, ideally to be seen this week.  Recommend continuing your regular regimen of lamotrigine.  If you have any further seizure episodes, develop numbness, weakness, vision changes, chest pain or difficulty breathing, please return here for reassessment.  Recommend not driving, operating heavy machinery, swimming until you are cleared by your neurologist.

## 2018-12-31 DIAGNOSIS — R748 Abnormal levels of other serum enzymes: Secondary | ICD-10-CM | POA: Diagnosis not present

## 2018-12-31 DIAGNOSIS — D72829 Elevated white blood cell count, unspecified: Secondary | ICD-10-CM | POA: Diagnosis not present

## 2018-12-31 DIAGNOSIS — G40209 Localization-related (focal) (partial) symptomatic epilepsy and epileptic syndromes with complex partial seizures, not intractable, without status epilepticus: Secondary | ICD-10-CM | POA: Diagnosis not present

## 2019-01-12 DIAGNOSIS — I10 Essential (primary) hypertension: Secondary | ICD-10-CM | POA: Diagnosis not present

## 2019-01-12 DIAGNOSIS — R739 Hyperglycemia, unspecified: Secondary | ICD-10-CM | POA: Diagnosis not present

## 2019-01-12 DIAGNOSIS — E782 Mixed hyperlipidemia: Secondary | ICD-10-CM | POA: Diagnosis not present

## 2019-01-12 DIAGNOSIS — G40209 Localization-related (focal) (partial) symptomatic epilepsy and epileptic syndromes with complex partial seizures, not intractable, without status epilepticus: Secondary | ICD-10-CM | POA: Diagnosis not present

## 2019-01-13 ENCOUNTER — Encounter: Payer: Self-pay | Admitting: Neurology

## 2019-01-13 ENCOUNTER — Ambulatory Visit: Payer: BLUE CROSS/BLUE SHIELD | Admitting: Neurology

## 2019-01-13 ENCOUNTER — Other Ambulatory Visit: Payer: Self-pay

## 2019-01-13 VITALS — BP 135/79 | HR 74 | Temp 97.2°F | Ht 74.0 in | Wt 273.0 lb

## 2019-01-13 DIAGNOSIS — G40009 Localization-related (focal) (partial) idiopathic epilepsy and epileptic syndromes with seizures of localized onset, not intractable, without status epilepticus: Secondary | ICD-10-CM | POA: Diagnosis not present

## 2019-01-13 MED ORDER — MIDAZOLAM 5 MG/0.1ML NA SOLN: NASAL | 5 refills | Status: AC

## 2019-01-13 MED ORDER — LAMOTRIGINE 200 MG PO TABS: 200.0000 mg | ORAL_TABLET | Freq: Two times a day (BID) | ORAL | 11 refills | Status: AC

## 2019-01-13 NOTE — Progress Notes (Signed)
[facility]    Provider:  Dr Lucia Gaskins Referring Provider: Lewis Moccasin, MD Primary Care Physician:  Lewis Moccasin, MD   Interval history 01/13/2019: This is the first seizure he had since I saw him. He had a seizure. Didn't miss medication. He was at home on the sofa in the morning watching TV. He knows when it is coming, wife is here and says he makes a breath and turns towards her she doesn't know which side he turned to. He makes a sound. Something breathy. It looked like he was starting to have a seizure, lasted a while a few minutes, he was jerking, he stopped breathing, he was purple and blue, he was not breathing, she thought he was "gone", he was post-ictal. No recent illnesses, no new medication, nothing new, no sleep deprivation, no missed medications.   Interval history 10/14/2016: MRi brain normal, reviewed images with patient today. He is doing well on Lamictal, no more seizures. Discussed medications   VVO:[id number] Barrettis a 48 y.o.malehere for seizures. He previously saw my colleague Dr. Elspeth Cho and is here today to see me after having another seizure. He has been on Lamictal, last seizure documented was in September 2015 after missing 2 doses of his AED.  Patient was doing well since 2015, no more events, no missing medications, he was driving and his vision started "closing", coworker was in the car. This was quicker, but similar to his previous seizures, he felt his vision closing in on him, his foot was on the gas and coworker had to steer the wheel and he was stiff and he was sore afterwards. He bit his jaw. No urination. He remembers waking up behind the wheel and then he remembers being in the ambulance. He was "in and out" sleepy and weak. He has had a full workup, multiple EEGs and MRIs without etiology but this was many years ago in 2011 or earlier. He has had multiple head trauma and that is why they think he has seizures. This last  seizure was during high stress and sleep deprivation. Wife is here and provides information. Also coworker who was in the car provided information, she heard him take a big breath, he started to stiffen up, total stiffness and he could not be moved, eyes open, lasted 3 minurs, mouth foamed, they called 911. He was beligerant afterwards. No recent illnesses but working hard with sleep deprivation.  I reviewed previous notes for my colleague Elspeth Cho below. I also reviewed discharge documents from recent hospitalization for break through seizure. CMP showed elevated glucose 155, CO2 16, anion gap 17 creatinine 1.61 otherwise unremarkable, CBC showed white blood cells 13 otherwise unremarkable. Reviewed CT of the chest report which showed negative CT scan of the chest with IV contrast material, CT of the abdomen and pelvis showed no acute abdominal or pelvic findings, CT of the thoracic and lumbar spine showed mild S-shaped curvature of the thoracolumbar spine, moderate to severe L5-S1 degenerative disc narrowing, CT scan of the cervical spine showed no evidence of cervical spine fracture, mild right convex curvature of the mid cervical spine mild degenerative disc narrowing at C4-C5 and C5-C6. CT of the head was negative without contrast material, normal.  CT head 12/2013 showed No acute intracranial abnormalities including mass lesion or mass effect, hydrocephalus, extra-axial fluid collection, midline shift, hemorrhage, or acute infarction, large ischemic events (personally reviewed images)  Lamictal level 2.3. Repeat creatinine 1.02 October 30th 2017,   Previous notes:  Verdia Kuba, MD 2015: Initial seizure was in 2002. During episodes he has LOC, he gets very stiff and tense and then has generalized shaking for 1 to 2 minutes. No loss of bowel/bladder. Did bite his tongue with this most recent event. Notes severe pain and discomfort related to this. Has had 3 events total. 2nd event was related  to trying to change his medication, 3rd event related to missing 2 doses of his lamictal. Has had MRI and EEG in the past, recently had head CT. Reports all tests were unremarkable. Unclear etiology of events. Confused post ictal. Initially was started on dilantin and then switched to lamictal. Tolerating it well.   Notes severe concussion in high school with LOC. Social EtOH, no drug use. No recent fevers, illnesses. No family history of seizures. He is otherwise healthy.  ED notes: 48 year old male presents with a seizure that occurred prior to arrival. He is on Lamictal and thinks he may have missed the last one to 2 doses. He's been on this for several years. His last seizure was in 2002. Back then he had 2 seizures within a couple weeks. He recently got over an upper respiratory infection. He has no more symptoms of this. Denies any recent fevers. No headaches, vomiting, or diarrhea. He fell and hit the back of his head during a seizure. He is not currently follow with a neurologist. He states he feels little hazy but has been improving the further out from the seizure he is gone. He bit his tongue on the right side during the seizure  Review of Systems: Patient complains of symptoms per HPI as well as the following symptoms: No rash, no fever, no CP, no SOB. Pertinent negatives and positives per HPI. All others negative.   Social History   Socioeconomic History   Marital status: Married    Spouse name: Alyce   Number of children: 2   Years of education: Grad Sch   Highest education level: Not on file  Occupational History    Employer: Glass blower/designer    Comment: Psychologist, sport and exercise strain: Not on file   Food insecurity    Worry: Not on file    Inability: Not on file   Transportation needs    Medical: Not on file    Non-medical: Not on file  Tobacco Use   Smoking status: Never Smoker   Smokeless tobacco: Never Used  Substance and Sexual  Activity   Alcohol use: Yes    Comment: 2-3 drinks per week   Drug use: No   Sexual activity: Not on file  Lifestyle   Physical activity    Days per week: Not on file    Minutes per session: Not on file   Stress: Not on file  Relationships   Social connections    Talks on phone: Not on file    Gets together: Not on file    Attends religious service: Not on file    Active member of club or organization: Not on file    Attends meetings of clubs or organizations: Not on file    Relationship status: Not on file   Intimate partner violence    Fear of current or ex partner: Not on file    Emotionally abused: Not on file    Physically abused: Not on file    Forced sexual activity: Not on file  Other Topics Concern   Not on file  Social History Narrative  Patient lives at home with his spouse.   Caffeine Use: 2 cups daily    Family History  Problem Relation Age of Onset   High blood pressure Mother    Diabetes Mother    Heart failure Mother    Lung cancer Father    Seizures Neg Hx     Past Medical History:  Diagnosis Date   Seizures (HCC) 2003    Past Surgical History:  Procedure Laterality Date   KNEE ARTHROSCOPY Right     Current Outpatient Medications  Medication Sig Dispense Refill   azelastine (ASTELIN) 0.1 % nasal spray Place into both nostrils as needed. Use in each nostril as directed     diphenhydrAMINE (BENADRYL) 25 MG tablet Take 25 mg by mouth every 6 (six) hours as needed for itching or allergies.     finasteride (PROPECIA) 1 MG tablet Take 1 mg by mouth daily.     hydrochlorothiazide (HYDRODIURIL) 25 MG tablet Take 25 mg by mouth daily.     lisinopril (PRINIVIL,ZESTRIL) 40 MG tablet Take 40 mg by mouth daily.  3   loratadine (CLARITIN) 10 MG tablet Take 10 mg by mouth daily.     rosuvastatin (CRESTOR) 5 MG tablet Take 5 mg by mouth daily.     lamoTRIgine (LAMICTAL) 200 MG tablet Take 1 tablet (200 mg total) by mouth 2 (two) times  daily. 60 tablet 11   Midazolam 5 MG/0.1ML SOLN Use 1 spray into one nostril for seizure and call 911. May repeat one dose after 10 minutes in other nostril. Max 2 doses per single episode every 3 days up to 5 episodes per month. 2 each 5   No current facility-administered medications for this visit.    Facility-Administered Medications Ordered in Other Visits  Medication Dose Route Frequency Provider Last Rate Last Dose   gadopentetate dimeglumine (MAGNEVIST) injection 20 mL  20 mL Intravenous Once PRN Anson FretAhern, Yeraldin Litzenberger B, MD        Allergies as of 01/13/2019   (No Known Allergies)    Vitals: BP 135/79 (BP Location: Right Arm, Patient Position: Sitting)    Pulse 74    Temp (!) 97.2 F (36.2 C) Comment: 97.5, taken by check-in staff   Ht 6\' 2"  (1.88 m)    Wt 273 lb (123.8 kg)    BMI 35.05 kg/m  Last Weight:  Wt Readings from Last 1 Encounters:  01/13/19 273 lb (123.8 kg)   Last Height:   Ht Readings from Last 1 Encounters:  01/13/19 6\' 2"  (1.88 m)   Physical exam: Exam: Gen: NAD, conversant, well nourised, well groomed                     CV: RRR, no MRG. No Carotid Bruits. No peripheral edema, warm, nontender Eyes: Conjunctivae clear without exudates or hemorrhage  Neuro: Detailed Neurologic Exam  Speech:    Speech is normal; fluent and spontaneous with normal comprehension.  Cognition:    The patient is oriented to person, place, and time;     recent and remote memory intact;     language fluent;     normal attention, concentration,     fund of knowledge Cranial Nerves:    The pupils are equal, round, and reactive to light. The fundi are normal and spontaneous venous pulsations are present. Visual fields are full to finger confrontation. Extraocular movements are intact. Trigeminal sensation is intact and the muscles of mastication are normal. The face is symmetric. The palate  elevates in the midline. Hearing intact. Voice is normal. Shoulder shrug is normal. The tongue  has normal motion without fasciculations.   Coordination:    Normal finger to nose and heel to shin. Normal rapid alternating movements.   Gait:    Heel-toe and tandem gait are normal.   Motor Observation:    No asymmetry, no atrophy, and no involuntary movements noted. Tone:    Normal muscle tone.    Posture:    Posture is normal. normal erect    Strength:    Strength is V/V in the upper and lower limbs.      Sensation: intact to LT     Reflex Exam:  DTR's:    Deep tendon reflexes in the upper and lower extremities are normal bilaterally.   Toes:    The toes are downgoing bilaterally.   Clonus:    Clonus is absent.      Assessment/Plan:  47y/o gentleman with history of seizure disorder since 2002 presenting for evaluation of breakthrough seizure, unprovoked, did not moiss his medication, on lamictal 155m bid.  Unclear etiology of his seizures, has been evaluated with multiple eegs and MRIs. He has an aura, is partial onset and then generalizes.    Partial onset seizure, starts with an aura, never able to localize on eeg, generalizes, post-ictal, most recent stopped breathing turned purple. I think at this point will send to [facility] or [facility] for long-term monitoring to possible localize part of brain - surgical options?  Increase Lamictal 200mg  twice daily  Need a trough level in 2 weeks, come in the morning prior to taking dose, bring dose with you  Midazolam nasal for seizure: discussed with patient and wife  Orders Placed This Encounter  Procedures   Lamotrigine level   CBC   Comprehensive metabolic panel   Ambulatory referral to Neurology   Meds ordered this encounter  Medications   lamoTRIgine (LAMICTAL) 200 MG tablet    Sig: Take 1 tablet (200 mg total) by mouth 2 (two) times daily.    Dispense:  60 tablet    Refill:  11   Midazolam 5 MG/0.1ML SOLN    Sig: Use 1 spray into one nostril for seizure and call 911. May repeat one dose after 10 minutes in  other nostril. Max 2 doses per single episode every 3 days up to 5 episodes per month.    Dispense:  2 each    Refill:  5     Per [facility] statutes, patients with seizures are not allowed to drive until they have been seizure-free for six months.    Use caution when using heavy equipment or power tools. Avoid working on ladders or at heights. Take showers instead of baths. Ensure the water temperature is not too high on the home water heater. Do not go swimming alone. Do not lock yourself in a room alone (i.e. bathroom). When caring for infants or small children, sit down when holding, feeding, or changing them to minimize risk of injury to the child in the event you have a seizure. Maintain good sleep hygiene. Avoid alcohol.    If patient has another seizure, call 911 and bring them back to the ED if: A.  The seizure lasts longer than 5 minutes.      B.  The patient doesn't wake shortly after the seizure or has new problems such as difficulty seeing, speaking or moving following the seizure C.  The patient was injured during the seizure  D.  The patient has a temperature over 102 F (39C) E.  The patient vomited during the seizure and now is having trouble breathing  [place] [facility] [place] statutes, patients with seizures are not allowed to drive until they have been seizure-free for six months.  Other recommendations include using caution when using heavy equipment or power tools. Avoid working on ladders or at heights. Take showers instead of baths.  Do not swim alone.  Ensure the water temperature is not too high on the home water heater. Do not go swimming alone. Do not lock yourself in a room alone (i.e. bathroom). When caring for infants or small children, sit down when holding, feeding, or changing them to minimize risk of injury to the child in the event you have a seizure. Maintain good sleep hygiene. Avoid alcohol.  Also recommend adequate sleep, hydration, good diet and minimize  stress.  During the Seizure  - First, ensure adequate ventilation and place patients on the floor on their left side  Loosen clothing around the neck and ensure the airway is patent. If the patient is clenching the teeth, do not force the mouth open with any object as this can cause severe damage - Remove all items from the surrounding that can be hazardous. The patient may be oblivious to what's happening and may not even know what he or she is doing. If the patient is confused and wandering, either gently guide him/her away and block access to outside areas - Reassure the individual and be comforting - Call 911. In most cases, the seizure ends before EMS arrives. However, there are cases when seizures may last over 3 to 5 minutes. Or the individual may have developed breathing difficulties or severe injuries. If a pregnant patient or a person with diabetes develops a seizure, it is prudent to call an ambulance. - Finally, if the patient does not regain full consciousness, then call EMS. Most patients will remain confused for about 45 to 90 minutes after a seizure, so you must use judgment in calling for help. - Avoid restraints but make sure the patient is in a bed with padded side rails - Place the individual in a lateral position with the neck slightly flexed; this will help the saliva drain from the mouth and prevent the tongue from falling backward - Remove all nearby furniture and other hazards from the area - Provide verbal assurance as the individual is regaining consciousness - Provide the patient with privacy if possible - Call for help and start treatment as ordered by the caregiver   fter the Seizure (Postictal Stage)  After a seizure, most patients experience confusion, fatigue, muscle pain and/or a headache. Thus, one should permit the individual to sleep. For the next few days, reassurance is essential. Being calm and helping reorient the person is also of importance.  Most  seizures are painless and end spontaneously. Seizures are not harmful to others but can lead to complications such as stress on the lungs, brain and the heart. Individuals with prior lung problems may develop labored breathing and respiratory distress.    Discussed Patients with epilepsy have a small risk of sudden unexpected death, a condition referred to as sudden unexpected death in epilepsy (SUDEP). SUDEP is defined specifically as the sudden, unexpected, witnessed or unwitnessed, nontraumatic and nondrowning death in patients with epilepsy with or without evidence for a seizure, and excluding documented status epilepticus, in which post mortem examination does not reveal a structural or toxicologic cause  for death   Cc: Dr. Maylene Roes, MD  [facility] [address] [place], [place] [address]  Phone [phone] Fax [phone]  A total of 40 minutes was spent face-to-face with this patient. Over half this time was spent on counseling patient on the epilepsy diagnosis and different diagnostic and therapeutic options available.

## 2019-01-13 NOTE — Patient Instructions (Addendum)
[facility] 2 weeks come back for labs  Per [place] statutes, patients with seizures are not allowed to drive until they have been seizure-free for six months.    Use caution when using heavy equipment or power tools. Avoid working on ladders or at heights. Take showers instead of baths. Ensure the water temperature is not too high on the home water heater. Do not go swimming alone. Do not lock yourself in a room alone (i.e. bathroom). When caring for infants or small children, sit down when holding, feeding, or changing them to minimize risk of injury to the child in the event you have a seizure. Maintain good sleep hygiene. Avoid alcohol.    If patient has another seizure, call 911 and bring them back to the ED if: A.  The seizure lasts longer than 5 minutes.      B.  The patient doesn't wake shortly after the seizure or has new problems such as difficulty seeing, speaking or moving following the seizure C.  The patient was injured during the seizure D.  The patient has a temperature over 102 F (39C) E.  The patient vomited during the seizure and now is having trouble breathing  Per [facility] statutes, patients with seizures are not allowed to drive until they have been seizure-free for six months.  Other recommendations include using caution when using heavy equipment or power tools. Avoid working on ladders or at heights. Take showers instead of baths.  Do not swim alone.  Ensure the water temperature is not too high on the home water heater. Do not go swimming alone. Do not lock yourself in a room alone (i.e. bathroom). When caring for infants or small children, sit down when holding, feeding, or changing them to minimize risk of injury to the child in the event you have a seizure. Maintain good sleep hygiene. Avoid alcohol.  Also recommend adequate sleep, hydration, good diet and minimize stress.  During the Seizure  - First, ensure adequate ventilation and place  patients on the floor on their left side  Loosen clothing around the neck and ensure the airway is patent. If the patient is clenching the teeth, do not force the mouth open with any object as this can cause severe damage - Remove all items from the surrounding that can be hazardous. The patient may be oblivious to what's happening and may not even know what he or she is doing. If the patient is confused and wandering, either gently guide him/her away and block access to outside areas - Reassure the individual and be comforting - Call 911. In most cases, the seizure ends before EMS arrives. However, there are cases when seizures may last over 3 to 5 minutes. Or the individual may have developed breathing difficulties or severe injuries. If a pregnant patient or a person with diabetes develops a seizure, it is prudent to call an ambulance. - Finally, if the patient does not regain full consciousness, then call EMS. Most patients will remain confused for about 45 to 90 minutes after a seizure, so you must use judgment in calling for help. - Avoid restraints but make sure the patient is in a bed with padded side rails - Place the individual in a lateral position with the neck slightly flexed; this will help the saliva drain from the mouth and prevent the tongue from falling backward - Remove all nearby furniture and other hazards from the area - Provide verbal assurance as the  individual is regaining consciousness - Provide the patient with privacy if possible - Call for help and start treatment as ordered by the caregiver   fter the Seizure (Postictal Stage)  After a seizure, most patients experience confusion, fatigue, muscle pain and/or a headache. Thus, one should permit the individual to sleep. For the next few days, reassurance is essential. Being calm and helping reorient the person is also of importance.  Most seizures are painless and end spontaneously. Seizures are not harmful to others but  can lead to complications such as stress on the lungs, brain and the heart. Individuals with prior lung problems may develop labored breathing and respiratory distress.   Lamotrigine tablets What is this medicine? LAMOTRIGINE (la MOE Hendricks Limes) is used to control seizures in adults and children with epilepsy and Lennox-Gastaut syndrome. It is also used in adults to treat bipolar disorder. This medicine may be used for other purposes; ask your health care provider or pharmacist if you have questions. COMMON BRAND NAME(S): Lamictal, Subvenite What should I tell my health care provider before I take this medicine? They need to know if you have any of these conditions:  aseptic meningitis during prior use of lamotrigine  depression  folate deficiency  kidney disease  liver disease  suicidal thoughts, plans, or attempt; a previous suicide attempt by you or a family member  an unusual or allergic reaction to lamotrigine or other seizure medications, other medicines, foods, dyes, or preservatives  pregnant or trying to get pregnant  breast-feeding How should I use this medicine? Take this medicine by mouth with a glass of water. Follow the directions on the prescription label. Do not chew these tablets. If this medicine upsets your stomach, take it with food or milk. Take your doses at regular intervals. Do not take your medicine more often than directed. A special MedGuide will be given to you by the pharmacist with each new prescription and refill. Be sure to read this information carefully each time. Talk to your pediatrician regarding the use of this medicine in children. While this drug may be prescribed for children as young as 2 years for selected conditions, precautions do apply. Overdosage: If you think you have taken too much of this medicine contact a poison control center or emergency room at once. NOTE: This medicine is only for you. Do not share this medicine with others. What  if I miss a dose? If you miss a dose, take it as soon as you can. If it is almost time for your next dose, take only that dose. Do not take double or extra doses. What may interact with this medicine?  atazanavir  carbamazepine  male hormones, including contraceptive or birth control pills  lopinavir  methotrexate  phenobarbital  phenytoin  primidone  pyrimethamine  rifampin  ritonavir  trimethoprim  valproic acid This list may not describe all possible interactions. Give your health care provider a list of all the medicines, herbs, non-prescription drugs, or dietary supplements you use. Also tell them if you smoke, drink alcohol, or use illegal drugs. Some items may interact with your medicine. What should I watch for while using this medicine? Visit your doctor or health care provider for regular checks on your progress. If you take this medicine for seizures, wear a Medic Alert bracelet or necklace. Carry an identification card with information about your condition, medicines, and doctor or health care provider. It is important to take this medicine exactly as directed. When first starting treatment,  your dose will need to be adjusted slowly. It may take weeks or months before your dose is stable. You should contact your doctor or health care provider if your seizures get worse or if you have any new types of seizures. Do not stop taking this medicine unless instructed by your doctor or health care provider. Stopping your medicine suddenly can increase your seizures or their severity. This medicine may cause serious skin reactions. They can happen weeks to months after starting the medicine. Contact your health care provider right away if you notice fevers or flu-like symptoms with a rash. The rash may be red or purple and then turn into blisters or peeling of the skin. Or, you might notice a red rash with swelling of the face, lips or lymph nodes in your neck or under your  arms. You may get drowsy, dizzy, or have blurred vision. Do not drive, use machinery, or do anything that needs mental alertness until you know how this medicine affects you. To reduce dizzy or fainting spells, do not sit or stand up quickly, especially if you are an older patient. Alcohol can increase drowsiness and dizziness. Avoid alcoholic drinks. If you are taking this medicine for bipolar disorder, it is important to report any changes in your mood to your doctor or health care provider. If your condition gets worse, you get mentally depressed, feel very hyperactive or manic, have difficulty sleeping, or have thoughts of hurting yourself or committing suicide, you need to get help from your health care provider right away. If you are a caregiver for someone taking this medicine for bipolar disorder, you should also report these behavioral changes right away. The use of this medicine may increase the chance of suicidal thoughts or actions. Pay special attention to how you are responding while on this medicine. Your mouth may get dry. Chewing sugarless gum or sucking hard candy, and drinking plenty of water may help. Contact your doctor if the problem does not go away or is severe. Women who become pregnant while using this medicine may enroll in the [place] [employer] Antiepileptic Drug Pregnancy Registry by calling [phone]. This registry collects information about the safety of antiepileptic drug use during pregnancy. This medicine may cause a decrease in folic acid. You should make sure that you get enough folic acid while you are taking this medicine. Discuss the foods you eat and the vitamins you take with your health care provider. What side effects may I notice from receiving this medicine? Side effects that you should report to your doctor or health care professional as soon as possible:  allergic reactions like skin rash, itching or hives, swelling of the face, lips, or tongue  changes in  vision  depressed mood  elevated mood, decreased need for sleep, racing thoughts, impulsive behavior  loss of balance or coordination  mouth sores  rash, fever, and swollen lymph nodes  redness, blistering, peeling or loosening of the skin, including inside the mouth  right upper belly pain  seizures  severe muscle pain  signs and symptoms of aseptic meningitis such as stiff neck and sensitivity to light, headache, drowsiness, fever, nausea, vomiting, rash  signs of infection - fever or chills, cough, sore throat, pain or difficulty passing urine  suicidal thoughts or other mood changes  swollen lymph nodes  trouble walking  unusual bruising or bleeding  unusually weak or tired  yellowing of the eyes or skin Side effects that usually do not require medical attention (report to  your doctor or health care professional if they continue or are bothersome):  diarrhea  dizziness  dry mouth  stuffy nose  tiredness  tremors  trouble sleeping This list may not describe all possible side effects. Call your doctor for medical advice about side effects. You may report side effects to FDA at 1-800-FDA-1088. Where should I keep my medicine? Keep out of reach of children. Store at room temperature between 15 and 30 degrees C (59 and 86 degrees F). Throw away any unused medicine after the expiration date. NOTE: This sheet is a summary. It may not cover all possible information. If you have questions about this medicine, talk to your doctor, pharmacist, or health care provider.  2020 Elsevier/Gold Standard (2018-07-17 15:03:40)   Seizure, Adult A seizure is a sudden burst of abnormal electrical activity in the brain. Seizures usually last from 30 seconds to 2 minutes. The abnormal activity temporarily interrupts normal brain function. A seizure can cause many different symptoms depending on where in the brain it starts. What are the causes? Common causes of this condition  include:  Fever or infection.  Brain abnormality, injury, bleeding, or tumor.  Low blood sugar.  Metabolic disorders or other conditions that are passed from parent to child (are inherited).  Reaction to a substance, such as a drug or a medicine, or suddenly stopping the use of a substance (withdrawal).  Stroke.  Developmental disorders such as autism or cerebral palsy. In some cases, the cause of this condition may not be known. Some people who have a seizure never have another one. Seizures usually do not cause brain damage or permanent problems unless they are prolonged. A person who has repeated seizures over time without a clear cause has a condition called epilepsy. What increases the risk? You are more likely to develop this condition if you have:  A family history of epilepsy.  Had a tonic-clonic seizure in the past. This is a type of seizure that involves whole-body contraction of muscles and a loss of consciousness.  Autism, cerebral palsy, or other brain disorders.  A history of head trauma, lack of oxygen at birth, or strokes. What are the signs or symptoms? There are many different types of seizures. The symptoms of a seizure vary depending on the type of seizure you have. Examples of symptoms during a seizure include:  Uncontrollable shaking (convulsions).  Stiffening of the body.  Loss of consciousness.  Head nodding.  Staring.  Not responding to sound or touch.  Loss of bladder or bowel control. Some people have symptoms right before a seizure happens (aura) and right after a seizure happens (postictal). Symptoms before a seizure may include:  Fear or anxiety.  Nausea.  Feeling like the room is spinning (vertigo).  A feeling of having seen or heard something before (dj vu).  Odd tastes or smells.  Changes in vision, such as seeing flashing lights or spots. Symptoms after a seizure may  include:  Confusion.  Sleepiness.  Headache.  Weakness on one side of the body. How is this diagnosed? This condition may be diagnosed based on:  A description of your symptoms. Video of your seizures can be helpful.  Your medical history.  A physical exam. You may also have tests, including:  Blood tests.  CT scan.  MRI.  Electroencephalogram (EEG). This test measures electrical activity in the brain. An EEG can predict whether seizures will return (recur).  A spinal tap (also called a lumbar puncture). This is the removal  and testing of fluid that surrounds the brain and spinal cord. How is this treated? Most seizures will stop on their own in under 5 minutes, and no treatment is needed. Seizures that last longer than 5 minutes will usually need treatment. Treatment can include:  Medicines given through an IV.  Avoiding known triggers, such as medicines that you take for another condition.  Medicines to treat epilepsy (antiepileptics), if epilepsy caused your seizures.  Surgery to stop seizures, if you have epilepsy that does not respond to medicines. Follow these instructions at home: Medicines  Take over-the-counter and prescription medicines only as told by your health care provider.  Avoid any substances that may prevent your medicine from working properly, such as alcohol. Activity  Do not drive, swim, or do any other activities that would be dangerous if you had another seizure. Wait until your health care provider says it is safe to do them.  If you live in the [place] check with your local DMV (department of motor vehicles) to find out about local driving laws. Each state has specific rules about when you can legally return to driving.  Get enough rest. Lack of sleep can make seizures more likely to occur. Educating others Teach friends and family what to do if you have a seizure. They should:  Lay you on the ground to prevent a fall.  Cushion your head  and body.  Loosen any tight clothing around your neck.  Turn you on your side. If vomiting occurs, this helps keep your airway clear.  Not hold you down. Holding you down will not stop the seizure.  Not put anything into your mouth.  Know whether or not you need emergency care. For example, they should get help right away if you have a seizure that lasts longer than 5 minutes or have several seizures in a row.  Stay with you until you recover.  General instructions  Contact your health care provider each time you have a seizure.  Avoid anything that has ever triggered a seizure for you.  Keep a seizure diary. Record what you remember about each seizure, especially anything that might have triggered the seizure.  Keep all follow-up visits as told by your health care provider. This is important. Contact a health care provider if:  You have another seizure.  You have seizures more often.  Your seizure symptoms change.  You continue to have seizures with treatment.  You have symptoms of an infection or illness. This might increase your risk of having a seizure. Get help right away if:  You have a seizure that: ? Lasts longer than 5 minutes. ? Is different than previous seizures. ? Leaves you unable to speak or use a part of your body. ? Makes it harder to breathe.  You have: ? A seizure after a head injury. ? Multiple seizures in a row. ? Confusion or a severe headache right after a seizure.  You do not wake up immediately after a seizure.  You injure yourself during a seizure. These symptoms may represent a serious problem that is an emergency. Do not wait to see if the symptoms will go away. Get medical help right away. Call your local emergency services (911 in the [place] Do not drive yourself to the hospital. Summary  Seizures are caused by abnormal electrical activity in the brain. The activity disrupts normal brain function and can cause various symptoms, such  as convulsions, abnormal movements, or a change in consciousness.  There are  many causes of seizures, including illnesses, medicines, genetic conditions, head injuries, strokes, tumors, substance abuse, or substance withdrawal.  Most seizures will stop on their own in under 5 minutes. Seizures that last longer than 5 minutes are a medical emergency and require immediate treatment.  Many medicines are used to treat seizures. Take over-the-counter and prescription medicines only as told by your health care provider. This information is not intended to replace advice given to you by your health care provider. Make sure you discuss any questions you have with your health care provider. Document Released: 04/12/2000 Document Revised: 07/03/2018 Document Reviewed: 07/03/2018 Elsevier Patient Education  2020 Elsevier Inc.  Midazolam nasal spray What is this medicine? MIDAZOLAM (MID ay zoe lam) is a benzodiazepine. It is used to treat certain types of seizures. This medicine may be used for other purposes; ask your health care provider or pharmacist if you have questions. COMMON BRAND NAME(S): Nayzilam What should I tell my health care provider before I take this medicine? They need to know if you have any of these conditions:  glaucoma  heart disease  history of drug abuse or alcohol abuse problem  kidney disease  liver disease  lung or breathing disease, like asthma  mental illness  suicidal thoughts, plans, or attempt; a previous suicide attempt by you or a family member  an unusual or allergic reaction to midazolam, other benzodiazepines, foods, dyes, or preservatives  pregnant or trying to get pregnant  breast-feeding How should I use this medicine? This medicine is for use in the nose. Follow the directions on your product or prescription label. Do not use more often than directed. Make sure that you are using your nasal spray correctly. Ask your doctor or health care  professional if you have any questions. Talk to your pediatrician regarding the use of this medicine in children. While this drug may be prescribed for children as young as 12 years for selected conditions, precautions do apply. Overdosage: If you think you have taken too much of this medicine contact a poison control center or emergency room at once. NOTE: This medicine is only for you. Do not share this medicine with others. What if I miss a dose? This does not apply. What may interact with this medicine? Do not take this medicine with any of the following medications:  boceprevir  certain antiviral medicines for HIV or AIDS  certain medicines for fungal infections like ketoconazole and itraconazole  grapefruit juice  idelalisib  narcotic medicines for cough  sodium oxybate  telaprevir This medicine may also interact with the following medications:  alcohol  antihistamines for allergy, cough and cold  calcium channel blockers like diltiazem and verapamil  certain antibiotics like clarithromycin, erythromycin, rifampin  certain antiviral medicines for HIV or AIDS  certain medicines for anxiety or sleep  certain medicines for depression, like amitriptyline, fluoxetine, sertraline  certain medicines for seizures like carbamazepine, phenobarbital, primidone  general anesthetics like halothane, isoflurane, methoxyflurane, propofol  local anesthetics like lidocaine, pramoxine, tetracaine  medicines that relax muscles for surgery  narcotic medicines for pain  phenothiazines like chlorpromazine, mesoridazine, prochlorperazine, thioridazine This list may not describe all possible interactions. Give your health care provider a list of all the medicines, herbs, non-prescription drugs, or dietary supplements you use. Also tell them if you smoke, drink alcohol, or use illegal drugs. Some items may interact with your medicine. What should I watch for while using this  medicine? Visit your healthcare professional for regular checks on your progress.  Tell your healthcare professional if your symptoms do not start to get better or if they get worse when you take this medicine. You may get drowsy or dizzy. Do not drive, use machinery, or do anything that needs mental alertness until you know how this medicine affects you. Do not stand up or sit up quickly, especially if you are an older patient. This reduces the risk of dizzy or fainting spells. Alcohol may interfere with the effect of this medicine. Avoid alcoholic drinks. If you are taking another medicine that also causes drowsiness, you may have more side effects. Give your health care provider a list of all medicines you use. Your doctor will tell you how much medicine to take. Do not take more medicine than directed. Call emergency for help if you have problems breathing or unusual sleepiness. What side effects may I notice from receiving this medicine? Side effects that you should report to your doctor or health care professional as soon as possible:  allergic reactions like skin rash, itching or hives, swelling of the face, lips, or tongue  breathing problems  excessive drowsiness  signs and symptoms of low blood pressure like dizziness; feeling faint or lightheaded, falls; unusually weak or tired  suicidal thoughts Side effects that usually do not require medical attention (report these to your doctor or health care professional if they continue or are bothersome):  drowsiness  headache  nose discomfort  runny nose  sore throat This list may not describe all possible side effects. Call your doctor for medical advice about side effects. You may report side effects to FDA at 1-800-FDA-1088. Where should I keep my medicine? Keep out of the reach of children. This medicine can be abused. Keep your medicine in a safe place to protect it from theft. Do not share this medicine with anyone. Selling or  giving away this medicine is dangerous and against the law. Store at room temperature between 15 and 30 degrees C (59 and 86 degrees F) in the blister package until ready to use. Throw away any unused medicine after the expiration date. This medicine may cause harm and death if it is taken by other adults, children, or pets. Return medicine that has not been used to an official disposal site. Contact the DEA at 769-543-6955 or your city/county government to find a site. If you cannot return the medicine, mix any unused medicine with a substance like cat litter or coffee grounds. Then throw the medicine away in a sealed container like a sealed bag or coffee can with a lid. Do not use the medicine after the expiration date. NOTE: This sheet is a summary. It may not cover all possible information. If you have questions about this medicine, talk to your doctor, pharmacist, or health care provider.  2020 Elsevier/Gold Standard (2017-09-18 10:10:46)

## 2019-01-14 ENCOUNTER — Telehealth: Payer: Self-pay | Admitting: *Deleted

## 2019-01-14 NOTE — Telephone Encounter (Signed)
completed on CMM Nayzilam 5 mg/0.1 mL PA. Key: [id number]. Awaiting [employer] determination.

## 2019-01-14 NOTE — Telephone Encounter (Signed)
Per CMM:   Message from Plan Prior Review Required: Not Required...[email or web address]

## 2019-01-19 DIAGNOSIS — R569 Unspecified convulsions: Secondary | ICD-10-CM | POA: Diagnosis not present

## 2019-01-27 ENCOUNTER — Other Ambulatory Visit: Payer: BC Managed Care – PPO

## 2019-02-09 ENCOUNTER — Encounter: Payer: Self-pay | Admitting: Sports Medicine

## 2019-02-09 ENCOUNTER — Ambulatory Visit (INDEPENDENT_AMBULATORY_CARE_PROVIDER_SITE_OTHER): Payer: BC Managed Care – PPO

## 2019-02-09 ENCOUNTER — Ambulatory Visit: Payer: BC Managed Care – PPO | Admitting: Sports Medicine

## 2019-02-09 ENCOUNTER — Other Ambulatory Visit: Payer: Self-pay

## 2019-02-09 ENCOUNTER — Other Ambulatory Visit: Payer: Self-pay | Admitting: Sports Medicine

## 2019-02-09 VITALS — BP 110/66 | HR 83 | Resp 16

## 2019-02-09 DIAGNOSIS — M79672 Pain in left foot: Secondary | ICD-10-CM | POA: Diagnosis not present

## 2019-02-09 DIAGNOSIS — M7662 Achilles tendinitis, left leg: Secondary | ICD-10-CM | POA: Diagnosis not present

## 2019-02-09 DIAGNOSIS — M216X1 Other acquired deformities of right foot: Secondary | ICD-10-CM

## 2019-02-09 DIAGNOSIS — M79671 Pain in right foot: Secondary | ICD-10-CM

## 2019-02-09 DIAGNOSIS — M722 Plantar fascial fibromatosis: Secondary | ICD-10-CM

## 2019-02-09 DIAGNOSIS — M7661 Achilles tendinitis, right leg: Secondary | ICD-10-CM | POA: Diagnosis not present

## 2019-02-09 DIAGNOSIS — M216X2 Other acquired deformities of left foot: Secondary | ICD-10-CM

## 2019-02-09 MED ORDER — MELOXICAM 15 MG PO TABS: 15.0000 mg | ORAL_TABLET | Freq: Every day | ORAL | 0 refills | Status: DC

## 2019-02-09 MED ORDER — PREDNISONE 10 MG (21) PO TBPK: ORAL_TABLET | ORAL | 0 refills | Status: DC

## 2019-02-09 NOTE — Progress Notes (Signed)
Subjective: Marc Hayden is a 48 y.o. male patient who presents to office for evaluation of bilateral heel and posterior ankle pain. Patient complains of continued in both feet for the last 3 weeks off and on has had this similar pain many years ago but it went away on its own reports that pain is at the bottoms of both heels and the back reports that he bought a night splint that did not help was playing tennis and then his Achilles on the right start to hurt so he stopped reports that it still bothers him when he has increased his walking or standing or going up and down inclines reports pain worse in the morning and the bottom of the heels but then throughout the day has some pain off and on at the back of the heels pain at worst is 5 out of 10.  Patient denies any acute trauma or injury or any other pedal complaints at this time.  Review of Systems  All other systems reviewed and are negative.    Patient Active Problem List   Diagnosis Date Noted  . Localization-related idiopathic epilepsy and epileptic syndromes with seizures of localized onset, not intractable, without status epilepticus ([place]) 02/29/2016  . Acidosis 02/21/2016  . Convulsions/seizures ([place]) 01/24/2014    Current Outpatient Medications on File Prior to Visit  Medication Sig Dispense Refill  . azelastine (ASTELIN) 0.1 % nasal spray Place into both nostrils as needed. Use in each nostril as directed    . diphenhydrAMINE (BENADRYL) 25 MG tablet Take 25 mg by mouth every 6 (six) hours as needed for itching or allergies.    . finasteride (PROPECIA) 1 MG tablet Take 1 mg by mouth daily.    . hydrochlorothiazide (HYDRODIURIL) 25 MG tablet Take 25 mg by mouth daily.    Marland Kitchen lamoTRIgine (LAMICTAL) 200 MG tablet Take 1 tablet (200 mg total) by mouth 2 (two) times daily. 60 tablet 11  . lisinopril (PRINIVIL,ZESTRIL) 40 MG tablet Take 40 mg by mouth daily.  3  . loratadine (CLARITIN) 10 MG tablet Take 10 mg by mouth daily.    .  Midazolam 5 MG/0.1ML SOLN Use 1 spray into one nostril for seizure and call 911. May repeat one dose after 10 minutes in other nostril. Max 2 doses per single episode every 3 days up to 5 episodes per month. 2 each 5  . rosuvastatin (CRESTOR) 5 MG tablet Take 5 mg by mouth daily.    . sildenafil (VIAGRA) 100 MG tablet      Current Facility-Administered Medications on File Prior to Visit  Medication Dose Route Frequency Provider Last Rate Last Dose  . gadopentetate dimeglumine (MAGNEVIST) injection 20 mL  20 mL Intravenous Once PRN Melvenia Beam, MD        No Known Allergies  Objective:  General: Alert and oriented x3 in no acute distress  Dermatology: No open lesions bilateral lower extremities, no webspace macerations, no ecchymosis bilateral, all nails x 10 are well manicured.  Vascular: Dorsalis Pedis and Posterior Tibial pedal pulses palpable, Capillary Fill Time 3 seconds,(+) pedal hair growth bilateral, no edema bilateral lower extremities, Temperature gradient within normal limits.  Neurology: Johney Maine sensation intact via light touch bilateral.   Musculoskeletal: Mild tenderness with palpation at plantar fascial insertion and Achilles tendon insertion bilateral.  There is tightness noted along the calf muscles bilateral.  Strength within normal limits in all groups bilateral.   Gait: Minimally antalgic gait  Xrays  Right and left foot  Impression: Early heel spur, mild thickening of plantar fascia, no other acute findings. Assessment and Plan: Problem List Items Addressed This Visit    None    Visit Diagnoses    Pain in both feet    -  Primary   Relevant Orders   DG Foot 2 Views Right   DG Foot 2 Views Left   Achilles tendonitis, bilateral       Plantar fasciitis, bilateral       Acquired equinus deformity of both feet           -Complete examination performed -Xrays reviewed -Discussed treatment options for Achilles tendinitis and plantar fasciitis bilateral  with equinus -Rx prednisone and meloxicam to take as instructed -Recommend continue with night splint and gentle stretching -Dispensed gel heel lifts bilateral -Patient to return to office in 4 weeks or sooner if condition worsens.  Advised patient if pain continues would advise injection at next visit.  Asencion Islam, DPM

## 2019-02-09 NOTE — Patient Instructions (Signed)
Achilles Tendinitis  with Rehab Achilles tendinitis is a disorder of the Achilles tendon. The Achilles tendon connects the large calf muscles (Gastrocnemius and Soleus) to the heel bone (calcaneus). This tendon is sometimes called the heel cord. It is important for pushing-off and standing on your toes and is important for walking, running, or jumping. Tendinitis is often caused by overuse and repetitive microtrauma. SYMPTOMS  Pain, tenderness, swelling, warmth, and redness may occur over the Achilles tendon even at rest.  Pain with pushing off, or flexing or extending the ankle.  Pain that is worsened after or during activity. CAUSES   Overuse sometimes seen with rapid increase in exercise programs or in sports requiring running and jumping.  Poor physical conditioning (strength and flexibility or endurance).  Running sports, especially training running down hills.  Inadequate warm-up before practice or play or failure to stretch before participation.  Injury to the tendon. PREVENTION   Warm up and stretch before practice or competition.  Allow time for adequate rest and recovery between practices and competition.  Keep up conditioning.  Keep up ankle and leg flexibility.  Improve or keep muscle strength and endurance.  Improve cardiovascular fitness.  Use proper technique.  Use proper equipment (shoes, skates).  To help prevent recurrence, taping, protective strapping, or an adhesive bandage may be recommended for several weeks after healing is complete. PROGNOSIS   Recovery may take weeks to several months to heal.  Longer recovery is expected if symptoms have been prolonged.  Recovery is usually quicker if the inflammation is due to a direct blow as compared with overuse or sudden strain. RELATED COMPLICATIONS   Healing time will be prolonged if the condition is not correctly treated. The injury must be given plenty of time to heal.  Symptoms can reoccur if  activity is resumed too soon.  Untreated, tendinitis may increase the risk of tendon rupture requiring additional time for recovery and possibly surgery. TREATMENT   The first treatment consists of rest anti-inflammatory medication, and ice to relieve the pain.  Stretching and strengthening exercises after resolution of pain will likely help reduce the risk of recurrence. Referral to a physical therapist or athletic trainer for further evaluation and treatment may be helpful.  A walking boot or cast may be recommended to rest the Achilles tendon. This can help break the cycle of inflammation and microtrauma.  Arch supports (orthotics) may be prescribed or recommended by your caregiver as an adjunct to therapy and rest.  Surgery to remove the inflamed tendon lining or degenerated tendon tissue is rarely necessary and has shown less than predictable results. MEDICATION   Nonsteroidal anti-inflammatory medications, such as aspirin and ibuprofen, may be used for pain and inflammation relief. Do not take within 7 days before surgery. Take these as directed by your caregiver. Contact your caregiver immediately if any bleeding, stomach upset, or signs of allergic reaction occur. Other minor pain relievers, such as acetaminophen, may also be used.  Pain relievers may be prescribed as necessary by your caregiver. Do not take prescription pain medication for longer than 4 to 7 days. Use only as directed and only as much as you need.  Cortisone injections are rarely indicated. Cortisone injections may weaken tendons and predispose to rupture. It is better to give the condition more time to heal than to use them. HEAT AND COLD  Cold is used to relieve pain and reduce inflammation for acute and chronic Achilles tendinitis. Cold should be applied for 10 to 15 minutes   every 2 to 3 hours for inflammation and pain and immediately after any activity that aggravates your symptoms. Use ice packs or an ice  massage.  Heat may be used before performing stretching and strengthening activities prescribed by your caregiver. Use a heat pack or a warm soak. SEEK MEDICAL CARE IF:  Symptoms get worse or do not improve in 2 weeks despite treatment.  New, unexplained symptoms develop. Drugs used in treatment may produce side effects.  EXERCISES:  RANGE OF MOTION (ROM) AND STRETCHING EXERCISES - Achilles Tendinitis  These exercises may help you when beginning to rehabilitate your injury. Your symptoms may resolve with or without further involvement from your physician, physical therapist or athletic trainer. While completing these exercises, remember:   Restoring tissue flexibility helps normal motion to return to the joints. This allows healthier, less painful movement and activity.  An effective stretch should be held for at least 30 seconds.  A stretch should never be painful. You should only feel a gentle lengthening or release in the stretched tissue.  STRETCH  Gastroc, Standing   Place hands on wall.  Extend right / left leg, keeping the front knee somewhat bent.  Slightly point your toes inward on your back foot.  Keeping your right / left heel on the floor and your knee straight, shift your weight toward the wall, not allowing your back to arch.  You should feel a gentle stretch in the right / left calf. Hold this position for 10 seconds. Repeat 3 times. Complete this stretch 2 times per day.  STRETCH  Soleus, Standing   Place hands on wall.  Extend right / left leg, keeping the other knee somewhat bent.  Slightly point your toes inward on your back foot.  Keep your right / left heel on the floor, bend your back knee, and slightly shift your weight over the back leg so that you feel a gentle stretch deep in your back calf.  Hold this position for 10 seconds. Repeat 3 times. Complete this stretch 2 times per day.  STRETCH  Gastrocsoleus, Standing  Note: This exercise can place  a lot of stress on your foot and ankle. Please complete this exercise only if specifically instructed by your caregiver.   Place the ball of your right / left foot on a step, keeping your other foot firmly on the same step.  Hold on to the wall or a rail for balance.  Slowly lift your other foot, allowing your body weight to press your heel down over the edge of the step.  You should feel a stretch in your right / left calf.  Hold this position for 10 seconds.  Repeat this exercise with a slight bend in your knee. Repeat 3 times. Complete this stretch 2 times per day.   STRENGTHENING EXERCISES - Achilles Tendinitis These exercises may help you when beginning to rehabilitate your injury. They may resolve your symptoms with or without further involvement from your physician, physical therapist or athletic trainer. While completing these exercises, remember:   Muscles can gain both the endurance and the strength needed for everyday activities through controlled exercises.  Complete these exercises as instructed by your physician, physical therapist or athletic trainer. Progress the resistance and repetitions only as guided.  You may experience muscle soreness or fatigue, but the pain or discomfort you are trying to eliminate should never worsen during these exercises. If this pain does worsen, stop and make certain you are following the directions exactly. If   the pain is still present after adjustments, discontinue the exercise until you can discuss the trouble with your clinician.  STRENGTH - Plantar-flexors   Sit with your right / left leg extended. Holding onto both ends of a rubber exercise band/tubing, loop it around the ball of your foot. Keep a slight tension in the band.  Slowly push your toes away from you, pointing them downward.  Hold this position for 10 seconds. Return slowly, controlling the tension in the band/tubing. Repeat 3 times. Complete this exercise 2 times per day.     STRENGTH - Plantar-flexors   Stand with your feet shoulder width apart. Steady yourself with a wall or table using as little support as needed.  Keeping your weight evenly spread over the width of your feet, rise up on your toes.*  Hold this position for 10 seconds. Repeat 3 times. Complete this exercise 2 times per day.  *If this is too easy, shift your weight toward your right / left leg until you feel challenged. Ultimately, you may be asked to do this exercise with your right / left foot only.  STRENGTH  Plantar-flexors, Eccentric  Note: This exercise can place a lot of stress on your foot and ankle. Please complete this exercise only if specifically instructed by your caregiver.   Place the balls of your feet on a step. With your hands, use only enough support from a wall or rail to keep your balance.  Keep your knees straight and rise up on your toes.  Slowly shift your weight entirely to your right / left toes and pick up your opposite foot. Gently and with controlled movement, lower your weight through your right / left foot so that your heel drops below the level of the step. You will feel a slight stretch in the back of your calf at the end position.  Use the healthy leg to help rise up onto the balls of both feet, then lower weight only on the right / left leg again. Build up to 15 repetitions. Then progress to 3 consecutive sets of 15 repetitions.*  After completing the above exercise, complete the same exercise with a slight knee bend (about 30 degrees). Again, build up to 15 repetitions. Then progress to 3 consecutive sets of 15 repetitions.* Perform this exercise 2 times per day.  *When you easily complete 3 sets of 15, your physician, physical therapist or athletic trainer may advise you to add resistance by wearing a backpack filled with additional weight.  STRENGTH - Plantar Flexors, Seated   Sit on a chair that allows your feet to rest flat on the ground. If  necessary, sit at the edge of the chair.  Keeping your toes firmly on the ground, lift your right / left heel as far as you can without increasing any discomfort in your ankle. Repeat 3 times. Complete this exercise 2 times a day.    Plantar Fasciitis (Heel Spur Syndrome) with Rehab The plantar fascia is a fibrous, ligament-like, soft-tissue structure that spans the bottom of the foot. Plantar fasciitis is a condition that causes pain in the foot due to inflammation of the tissue. SYMPTOMS   Pain and tenderness on the underneath side of the foot.  Pain that worsens with standing or walking. CAUSES  Plantar fasciitis is caused by irritation and injury to the plantar fascia on the underneath side of the foot. Common mechanisms of injury include:  Direct trauma to bottom of the foot.  Damage to a small   nerve that runs under the foot where the main fascia attaches to the heel bone.  Stress placed on the plantar fascia due to bone spurs. RISK INCREASES WITH:   Activities that place stress on the plantar fascia (running, jumping, pivoting, or cutting).  Poor strength and flexibility.  Improperly fitted shoes.  Tight calf muscles.  Flat feet.  Failure to warm-up properly before activity.  Obesity. PREVENTION  Warm up and stretch properly before activity.  Allow for adequate recovery between workouts.  Maintain physical fitness:  Strength, flexibility, and endurance.  Cardiovascular fitness.  Maintain a health body weight.  Avoid stress on the plantar fascia.  Wear properly fitted shoes, including arch supports for individuals who have flat feet.  PROGNOSIS  If treated properly, then the symptoms of plantar fasciitis usually resolve without surgery. However, occasionally surgery is necessary.  RELATED COMPLICATIONS   Recurrent symptoms that may result in a chronic condition.  Problems of the lower back that are caused by compensating for the injury, such as  limping.  Pain or weakness of the foot during push-off following surgery.  Chronic inflammation, scarring, and partial or complete fascia tear, occurring more often from repeated injections.  TREATMENT  Treatment initially involves the use of ice and medication to help reduce pain and inflammation. The use of strengthening and stretching exercises may help reduce pain with activity, especially stretches of the Achilles tendon. These exercises may be performed at home or with a therapist. Your caregiver may recommend that you use heel cups of arch supports to help reduce stress on the plantar fascia. Occasionally, corticosteroid injections are given to reduce inflammation. If symptoms persist for greater than 6 months despite non-surgical (conservative), then surgery may be recommended.   MEDICATION   If pain medication is necessary, then nonsteroidal anti-inflammatory medications, such as aspirin and ibuprofen, or other minor pain relievers, such as acetaminophen, are often recommended.  Do not take pain medication within 7 days before surgery.  Prescription pain relievers may be given if deemed necessary by your caregiver. Use only as directed and only as much as you need.  Corticosteroid injections may be given by your caregiver. These injections should be reserved for the most serious cases, because they may only be given a certain number of times.  HEAT AND COLD  Cold treatment (icing) relieves pain and reduces inflammation. Cold treatment should be applied for 10 to 15 minutes every 2 to 3 hours for inflammation and pain and immediately after any activity that aggravates your symptoms. Use ice packs or massage the area with a piece of ice (ice massage).  Heat treatment may be used prior to performing the stretching and strengthening activities prescribed by your caregiver, physical therapist, or athletic trainer. Use a heat pack or soak the injury in warm water.  SEEK IMMEDIATE MEDICAL  CARE IF:  Treatment seems to offer no benefit, or the condition worsens.  Any medications produce adverse side effects.  EXERCISES- RANGE OF MOTION (ROM) AND STRETCHING EXERCISES - Plantar Fasciitis (Heel Spur Syndrome) These exercises may help you when beginning to rehabilitate your injury. Your symptoms may resolve with or without further involvement from your physician, physical therapist or athletic trainer. While completing these exercises, remember:   Restoring tissue flexibility helps normal motion to return to the joints. This allows healthier, less painful movement and activity.  An effective stretch should be held for at least 30 seconds.  A stretch should never be painful. You should only feel a gentle lengthening   or release in the stretched tissue.  RANGE OF MOTION - Toe Extension, Flexion  Sit with your right / left leg crossed over your opposite knee.  Grasp your toes and gently pull them back toward the top of your foot. You should feel a stretch on the bottom of your toes and/or foot.  Hold this stretch for 10 seconds.  Now, gently pull your toes toward the bottom of your foot. You should feel a stretch on the top of your toes and or foot.  Hold this stretch for 10 seconds. Repeat  times. Complete this stretch 3 times per day.   RANGE OF MOTION - Ankle Dorsiflexion, Active Assisted  Remove shoes and sit on a chair that is preferably not on a carpeted surface.  Place right / left foot under knee. Extend your opposite leg for support.  Keeping your heel down, slide your right / left foot back toward the chair until you feel a stretch at your ankle or calf. If you do not feel a stretch, slide your bottom forward to the edge of the chair, while still keeping your heel down.  Hold this stretch for 10 seconds. Repeat 3 times. Complete this stretch 2 times per day.   STRETCH  Gastroc, Standing  Place hands on wall.  Extend right / left leg, keeping the front knee  somewhat bent.  Slightly point your toes inward on your back foot.  Keeping your right / left heel on the floor and your knee straight, shift your weight toward the wall, not allowing your back to arch.  You should feel a gentle stretch in the right / left calf. Hold this position for 10 seconds. Repeat 3 times. Complete this stretch 2 times per day.  STRETCH  Soleus, Standing  Place hands on wall.  Extend right / left leg, keeping the other knee somewhat bent.  Slightly point your toes inward on your back foot.  Keep your right / left heel on the floor, bend your back knee, and slightly shift your weight over the back leg so that you feel a gentle stretch deep in your back calf.  Hold this position for 10 seconds. Repeat 3 times. Complete this stretch 2 times per day.  STRETCH  Gastrocsoleus, Standing  Note: This exercise can place a lot of stress on your foot and ankle. Please complete this exercise only if specifically instructed by your caregiver.   Place the ball of your right / left foot on a step, keeping your other foot firmly on the same step.  Hold on to the wall or a rail for balance.  Slowly lift your other foot, allowing your body weight to press your heel down over the edge of the step.  You should feel a stretch in your right / left calf.  Hold this position for 10 seconds.  Repeat this exercise with a slight bend in your right / left knee. Repeat 3 times. Complete this stretch 2 times per day.   STRENGTHENING EXERCISES - Plantar Fasciitis (Heel Spur Syndrome)  These exercises may help you when beginning to rehabilitate your injury. They may resolve your symptoms with or without further involvement from your physician, physical therapist or athletic trainer. While completing these exercises, remember:   Muscles can gain both the endurance and the strength needed for everyday activities through controlled exercises.  Complete these exercises as instructed by  your physician, physical therapist or athletic trainer. Progress the resistance and repetitions only as guided.  STRENGTH -   Towel Curls  Sit in a chair positioned on a non-carpeted surface.  Place your foot on a towel, keeping your heel on the floor.  Pull the towel toward your heel by only curling your toes. Keep your heel on the floor. Repeat 3 times. Complete this exercise 2 times per day.  STRENGTH - Ankle Inversion  Secure one end of a rubber exercise band/tubing to a fixed object (table, pole). Loop the other end around your foot just before your toes.  Place your fists between your knees. This will focus your strengthening at your ankle.  Slowly, pull your big toe up and in, making sure the band/tubing is positioned to resist the entire motion.  Hold this position for 10 seconds.  Have your muscles resist the band/tubing as it slowly pulls your foot back to the starting position. Repeat 3 times. Complete this exercises 2 times per day.  Document Released: 04/15/2005 Document Revised: 07/08/2011 Document Reviewed: 07/28/2008 [employer] Patient Information 2014 [employer].  

## 2019-03-16 ENCOUNTER — Ambulatory Visit: Payer: BC Managed Care – PPO | Admitting: Sports Medicine

## 2019-04-26 DIAGNOSIS — E782 Mixed hyperlipidemia: Secondary | ICD-10-CM | POA: Diagnosis not present

## 2019-04-26 DIAGNOSIS — I10 Essential (primary) hypertension: Secondary | ICD-10-CM | POA: Diagnosis not present

## 2019-04-26 DIAGNOSIS — R748 Abnormal levels of other serum enzymes: Secondary | ICD-10-CM | POA: Diagnosis not present

## 2019-04-26 DIAGNOSIS — N529 Male erectile dysfunction, unspecified: Secondary | ICD-10-CM | POA: Diagnosis not present

## 2019-04-28 DIAGNOSIS — Z23 Encounter for immunization: Secondary | ICD-10-CM | POA: Diagnosis not present

## 2019-05-14 ENCOUNTER — Other Ambulatory Visit: Payer: BC Managed Care – PPO

## 2019-05-17 ENCOUNTER — Other Ambulatory Visit: Payer: BC Managed Care – PPO

## 2019-05-17 ENCOUNTER — Ambulatory Visit: Payer: BC Managed Care – PPO | Attending: Internal Medicine

## 2019-05-17 DIAGNOSIS — Z20822 Contact with and (suspected) exposure to covid-19: Secondary | ICD-10-CM

## 2019-05-18 ENCOUNTER — Other Ambulatory Visit: Payer: BC Managed Care – PPO

## 2019-05-18 LAB — NOVEL CORONAVIRUS, NAA: SARS-CoV-2, NAA: NOT DETECTED

## 2019-05-31 ENCOUNTER — Ambulatory Visit: Payer: BC Managed Care – PPO | Attending: Internal Medicine

## 2019-05-31 ENCOUNTER — Other Ambulatory Visit: Payer: BC Managed Care – PPO

## 2019-05-31 DIAGNOSIS — Z20822 Contact with and (suspected) exposure to covid-19: Secondary | ICD-10-CM

## 2019-06-01 LAB — NOVEL CORONAVIRUS, NAA: SARS-CoV-2, NAA: NOT DETECTED

## 2019-07-12 ENCOUNTER — Telehealth: Payer: Self-pay | Admitting: Neurology

## 2019-07-12 NOTE — Telephone Encounter (Signed)
FYI pt has called back and states he does not feel the appointment is needed at this time so he has asked that it be cancelled.No call back requested

## 2019-07-12 NOTE — Telephone Encounter (Signed)
Noted thanks °

## 2019-07-12 NOTE — Progress Notes (Deleted)
[facility]    Provider:  Dr Lucia Gaskins Referring Provider: Lewis Moccasin, MD Primary Care Physician:  Lewis Moccasin, MD   Interval history 01/13/2019: This is the first seizure he had since I saw him. He had a seizure. Didn't miss medication. He was at home on the sofa in the morning watching TV. He knows when it is coming, wife is here and says he makes a breath and turns towards her she doesn't know which side he turned to. He makes a sound. Something breathy. It looked like he was starting to have a seizure, lasted a while a few minutes, he was jerking, he stopped breathing, he was purple and blue, he was not breathing, she thought he was "gone", he was post-ictal. No recent illnesses, no new medication, nothing new, no sleep deprivation, no missed medications.   Interval history 10/14/2016: MRi brain normal, reviewed images with patient today. He is doing well on Lamictal, no more seizures. Discussed medications   BMW:UXLKGMWN Barrettis a 49 y.o.malehere for seizures. He previously saw my colleague Dr. Elspeth Cho and is here today to see me after having another seizure. He has been on Lamictal, last seizure documented was in September 2015 after missing 2 doses of his AED.  Patient was doing well since 2015, no more events, no missing medications, he was driving and his vision started "closing", coworker was in the car. This was quicker, but similar to his previous seizures, he felt his vision closing in on him, his foot was on the gas and coworker had to steer the wheel and he was stiff and he was sore afterwards. He bit his jaw. No urination. He remembers waking up behind the wheel and then he remembers being in the ambulance. He was "in and out" sleepy and weak. He has had a full workup, multiple EEGs and MRIs without etiology but this was many years ago in 2011 or earlier. He has had multiple head trauma and that is why they think he has seizures. This last  seizure was during high stress and sleep deprivation. Wife is here and provides information. Also coworker who was in the car provided information, she heard him take a big breath, he started to stiffen up, total stiffness and he could not be moved, eyes open, lasted 3 minurs, mouth foamed, they called 911. He was beligerant afterwards. No recent illnesses but working hard with sleep deprivation.  I reviewed previous notes for my colleague Elspeth Cho below. I also reviewed discharge documents from recent hospitalization for break through seizure. CMP showed elevated glucose 155, CO2 16, anion gap 17 creatinine 1.61 otherwise unremarkable, CBC showed white blood cells 13 otherwise unremarkable. Reviewed CT of the chest report which showed negative CT scan of the chest with IV contrast material, CT of the abdomen and pelvis showed no acute abdominal or pelvic findings, CT of the thoracic and lumbar spine showed mild S-shaped curvature of the thoracolumbar spine, moderate to severe L5-S1 degenerative disc narrowing, CT scan of the cervical spine showed no evidence of cervical spine fracture, mild right convex curvature of the mid cervical spine mild degenerative disc narrowing at C4-C5 and C5-C6. CT of the head was negative without contrast material, normal.  CT head 12/2013 showed No acute intracranial abnormalities including mass lesion or mass effect, hydrocephalus, extra-axial fluid collection, midline shift, hemorrhage, or acute infarction, large ischemic events (personally reviewed images)  Lamictal level 2.3. Repeat creatinine 1.02 October 30th 2017,   Previous notes:  Danne HarborPete Sumner, MD 2015: Initial seizure was in 2002. During episodes he has LOC, he gets very stiff and tense and then has generalized shaking for 1 to 2 minutes. No loss of bowel/bladder. Did bite his tongue with this most recent event. Notes severe pain and discomfort related to this. Has had 3 events total. 2nd event was related  to trying to change his medication, 3rd event related to missing 2 doses of his lamictal. Has had MRI and EEG in the past, recently had head CT. Reports all tests were unremarkable. Unclear etiology of events. Confused post ictal. Initially was started on dilantin and then switched to lamictal. Tolerating it well.   Notes severe concussion in high school with LOC. Social EtOH, no drug use. No recent fevers, illnesses. No family history of seizures. He is otherwise healthy.  ED notes: 49 year old male presents with a seizure that occurred prior to arrival. He is on Lamictal and thinks he may have missed the last one to 2 doses. He's been on this for several years. His last seizure was in 2002. Back then he had 2 seizures within a couple weeks. He recently got over an upper respiratory infection. He has no more symptoms of this. Denies any recent fevers. No headaches, vomiting, or diarrhea. He fell and hit the back of his head during a seizure. He is not currently follow with a neurologist. He states he feels little hazy but has been improving the further out from the seizure he is gone. He bit his tongue on the right side during the seizure  Review of Systems: Patient complains of symptoms per HPI as well as the following symptoms: No rash, no fever, no CP, no SOB. Pertinent negatives and positives per HPI. All others negative.   Social History   Socioeconomic History  . Marital status: Married    Spouse name: Alyce  . Number of children: 2  . Years of education: Grad Sch  . Highest education level: Not on file  Occupational History    Employer: [employer]    Comment: [employer]  Tobacco Use  . Smoking status: Never Smoker  . Smokeless tobacco: Never Used  Substance and Sexual Activity  . Alcohol use: Yes    Comment: 2-3 drinks per week  . Drug use: No  . Sexual activity: Not on file  Other Topics Concern  . Not on file  Social History Narrative   Patient lives at home with  his spouse.   Caffeine Use: 2 cups daily   Social Determinants of Health   Financial Resource Strain:   . Difficulty of Paying Living Expenses:   Food Insecurity:   . Worried About Programme researcher, broadcasting/film/videounning Out of Food in the Last Year:   . Baristaan Out of Food in the Last Year:   Transportation Needs:   . Freight forwarderLack of Transportation (Medical):   Marland Kitchen. Lack of Transportation (Non-Medical):   Physical Activity:   . Days of Exercise per Week:   . Minutes of Exercise per Session:   Stress:   . Feeling of Stress :   Social Connections:   . Frequency of Communication with Friends and Family:   . Frequency of Social Gatherings with Friends and Family:   . Attends Religious Services:   . Active Member of Clubs or Organizations:   . Attends BankerClub or Organization Meetings:   Marland Kitchen. Marital Status:   Intimate Partner Violence:   . Fear of Current or Ex-Partner:   . Emotionally Abused:   .  Physically Abused:   . Sexually Abused:     Family History  Problem Relation Age of Onset  . High blood pressure Mother   . Diabetes Mother   . Heart failure Mother   . Lung cancer Father   . Seizures Neg Hx     Past Medical History:  Diagnosis Date  . Seizures (HCC) 2003    Past Surgical History:  Procedure Laterality Date  . KNEE ARTHROSCOPY Right     Current Outpatient Medications  Medication Sig Dispense Refill  . azelastine (ASTELIN) 0.1 % nasal spray Place into both nostrils as needed. Use in each nostril as directed    . diphenhydrAMINE (BENADRYL) 25 MG tablet Take 25 mg by mouth every 6 (six) hours as needed for itching or allergies.    . finasteride (PROPECIA) 1 MG tablet Take 1 mg by mouth daily.    . hydrochlorothiazide (HYDRODIURIL) 25 MG tablet Take 25 mg by mouth daily.    Marland Kitchen lamoTRIgine (LAMICTAL) 200 MG tablet Take 1 tablet (200 mg total) by mouth 2 (two) times daily. 60 tablet 11  . lisinopril (PRINIVIL,ZESTRIL) 40 MG tablet Take 40 mg by mouth daily.  3  . loratadine (CLARITIN) 10 MG tablet Take 10 mg by  mouth daily.    . meloxicam (MOBIC) 15 MG tablet Take 1 tablet (15 mg total) by mouth daily. 30 tablet 0  . Midazolam 5 MG/0.1ML SOLN Use 1 spray into one nostril for seizure and call 911. May repeat one dose after 10 minutes in other nostril. Max 2 doses per single episode every 3 days up to 5 episodes per month. 2 each 5  . predniSONE (STERAPRED UNI-PAK 21 TAB) 10 MG (21) TBPK tablet Take as directed 21 tablet 0  . rosuvastatin (CRESTOR) 5 MG tablet Take 5 mg by mouth daily.    . sildenafil (VIAGRA) 100 MG tablet      No current facility-administered medications for this visit.   Facility-Administered Medications Ordered in Other Visits  Medication Dose Route Frequency Provider Last Rate Last Admin  . gadopentetate dimeglumine (MAGNEVIST) injection 20 mL  20 mL Intravenous Once PRN Anson Fret, MD        Allergies as of 07/13/2019  . (No Known Allergies)    Vitals: There were no vitals taken for this visit. Last Weight:  Wt Readings from Last 1 Encounters:  01/13/19 273 lb (123.8 kg)   Last Height:   Ht Readings from Last 1 Encounters:  01/13/19 6\' 2"  (1.88 m)   Physical exam: Exam: Gen: NAD, conversant, well nourised, well groomed                     CV: RRR, no MRG. No Carotid Bruits. No peripheral edema, warm, nontender Eyes: Conjunctivae clear without exudates or hemorrhage  Neuro: Detailed Neurologic Exam  Speech:    Speech is normal; fluent and spontaneous with normal comprehension.  Cognition:    The patient is oriented to person, place, and time;     recent and remote memory intact;     language fluent;     normal attention, concentration,     fund of knowledge Cranial Nerves:    The pupils are equal, round, and reactive to light. The fundi are normal and spontaneous venous pulsations are present. Visual fields are full to finger confrontation. Extraocular movements are intact. Trigeminal sensation is intact and the muscles of mastication are normal. The  face is symmetric. The palate elevates in  the midline. Hearing intact. Voice is normal. Shoulder shrug is normal. The tongue has normal motion without fasciculations.   Coordination:    Normal finger to nose and heel to shin. Normal rapid alternating movements.   Gait:    Heel-toe and tandem gait are normal.   Motor Observation:    No asymmetry, no atrophy, and no involuntary movements noted. Tone:    Normal muscle tone.    Posture:    Posture is normal. normal erect    Strength:    Strength is V/V in the upper and lower limbs.      Sensation: intact to LT     Reflex Exam:  DTR's:    Deep tendon reflexes in the upper and lower extremities are normal bilaterally.   Toes:    The toes are downgoing bilaterally.   Clonus:    Clonus is absent.      Assessment/Plan:  49y/o gentleman with history of seizure disorder since 2002 presenting for evaluation of breakthrough seizure, unprovoked, did not moiss his medication, on lamictal 124m bid.  Unclear etiology of his seizures, has been evaluated with multiple eegs and MRIs. He has an aura, is partial onset and then generalizes.    Partial onset seizure, starts with an aura, never able to localize on eeg, generalizes, post-ictal, most recent stopped breathing turned purple. I think at this point will send to [facility] or [facility] for long-term monitoring to possible localize part of brain - surgical options?  Increase Lamictal 200mg  twice daily  Need a trough level in 2 weeks, come in the morning prior to taking dose, bring dose with you  Midazolam nasal for seizure: discussed with patient and wife  No orders of the defined types were placed in this encounter.  No orders of the defined types were placed in this encounter.    Per [facility] statutes, patients with seizures are not allowed to drive until they have been seizure-free for six months.    Use caution when using heavy equipment or power tools. Avoid working on ladders  or at heights. Take showers instead of baths. Ensure the water temperature is not too high on the home water heater. Do not go swimming alone. Do not lock yourself in a room alone (i.e. bathroom). When caring for infants or small children, sit down when holding, feeding, or changing them to minimize risk of injury to the child in the event you have a seizure. Maintain good sleep hygiene. Avoid alcohol.    If patient has another seizure, call 911 and bring them back to the ED if: A.  The seizure lasts longer than 5 minutes.      B.  The patient doesn't wake shortly after the seizure or has new problems such as difficulty seeing, speaking or moving following the seizure C.  The patient was injured during the seizure D.  The patient has a temperature over 102 F (39C) E.  The patient vomited during the seizure and now is having trouble breathing  Per [facility] statutes, patients with seizures are not allowed to drive until they have been seizure-free for six months.  Other recommendations include using caution when using heavy equipment or power tools. Avoid working on ladders or at heights. Take showers instead of baths.  Do not swim alone.  Ensure the water temperature is not too high on the home water heater. Do not go swimming alone. Do not lock yourself in a room alone (i.e. bathroom). When caring  for infants or small children, sit down when holding, feeding, or changing them to minimize risk of injury to the child in the event you have a seizure. Maintain good sleep hygiene. Avoid alcohol.  Also recommend adequate sleep, hydration, good diet and minimize stress.  During the Seizure  - First, ensure adequate ventilation and place patients on the floor on their left side  Loosen clothing around the neck and ensure the airway is patent. If the patient is clenching the teeth, do not force the mouth open with any object as this can cause severe damage - Remove all items from the surrounding that  can be hazardous. The patient may be oblivious to what's happening and may not even know what he or she is doing. If the patient is confused and wandering, either gently guide him/her away and block access to outside areas - Reassure the individual and be comforting - Call 911. In most cases, the seizure ends before EMS arrives. However, there are cases when seizures may last over 3 to 5 minutes. Or the individual may have developed breathing difficulties or severe injuries. If a pregnant patient or a person with diabetes develops a seizure, it is prudent to call an ambulance. - Finally, if the patient does not regain full consciousness, then call EMS. Most patients will remain confused for about 45 to 90 minutes after a seizure, so you must use judgment in calling for help. - Avoid restraints but make sure the patient is in a bed with padded side rails - Place the individual in a lateral position with the neck slightly flexed; this will help the saliva drain from the mouth and prevent the tongue from falling backward - Remove all nearby furniture and other hazards from the area - Provide verbal assurance as the individual is regaining consciousness - Provide the patient with privacy if possible - Call for help and start treatment as ordered by the caregiver   fter the Seizure (Postictal Stage)  After a seizure, most patients experience confusion, fatigue, muscle pain and/or a headache. Thus, one should permit the individual to sleep. For the next few days, reassurance is essential. Being calm and helping reorient the person is also of importance.  Most seizures are painless and end spontaneously. Seizures are not harmful to others but can lead to complications such as stress on the lungs, brain and the heart. Individuals with prior lung problems may develop labored breathing and respiratory distress.    Discussed Patients with epilepsy have a small risk of sudden unexpected death, a condition  referred to as sudden unexpected death in epilepsy (SUDEP). SUDEP is defined specifically as the sudden, unexpected, witnessed or unwitnessed, nontraumatic and nondrowning death in patients with epilepsy with or without evidence for a seizure, and excluding documented status epilepticus, in which post mortem examination does not reveal a structural or toxicologic cause for death   Cc: Dr. Maylene Roes, MD  [facility] [address] [place], [place] [address]  Phone [phone] Fax [phone]  A total of 40 minutes was spent face-to-face with this patient. Over half this time was spent on counseling patient on the epilepsy diagnosis and different diagnostic and therapeutic options available.

## 2019-07-12 NOTE — Telephone Encounter (Signed)
Patient was transferred to [facility], I called and left him a message to see if he would like to cancel appointment tomorrow since he is now managed by [facility]. thanks

## 2019-07-13 ENCOUNTER — Ambulatory Visit: Payer: BC Managed Care – PPO | Admitting: Neurology

## 2019-08-31 ENCOUNTER — Other Ambulatory Visit: Payer: Self-pay | Admitting: Family Medicine

## 2019-08-31 DIAGNOSIS — Z87442 Personal history of urinary calculi: Secondary | ICD-10-CM | POA: Diagnosis not present

## 2019-08-31 DIAGNOSIS — M545 Low back pain: Secondary | ICD-10-CM | POA: Diagnosis not present

## 2019-08-31 DIAGNOSIS — R109 Unspecified abdominal pain: Secondary | ICD-10-CM

## 2019-08-31 DIAGNOSIS — N12 Tubulo-interstitial nephritis, not specified as acute or chronic: Secondary | ICD-10-CM | POA: Diagnosis not present

## 2019-09-01 ENCOUNTER — Ambulatory Visit
Admission: RE | Admit: 2019-09-01 | Discharge: 2019-09-01 | Disposition: A | Discharge status: Home / Self Care | Payer: BC Managed Care – PPO | Source: Ambulatory Visit | Attending: Family Medicine | Admitting: Family Medicine

## 2019-09-01 DIAGNOSIS — R109 Unspecified abdominal pain: Secondary | ICD-10-CM | POA: Diagnosis not present

## 2019-09-07 MED ORDER — MELOXICAM 15 MG PO TABS: 15.0000 mg | ORAL_TABLET | Freq: Every day | ORAL | 1 refills | Status: AC

## 2019-09-07 NOTE — Addendum Note (Signed)
Addended by: Fritz Pickerel A on: 09/07/2019 08:43 AM   Modules accepted: Orders

## 2019-09-09 ENCOUNTER — Encounter: Payer: Self-pay | Admitting: Allergy

## 2019-09-09 ENCOUNTER — Other Ambulatory Visit: Payer: Self-pay

## 2019-09-09 ENCOUNTER — Ambulatory Visit: Payer: BC Managed Care – PPO | Admitting: Allergy

## 2019-09-09 VITALS — BP 124/76 | HR 102 | Temp 97.3°F | Resp 16 | Ht 74.49 in | Wt 273.8 lb

## 2019-09-09 DIAGNOSIS — Z8709 Personal history of other diseases of the respiratory system: Secondary | ICD-10-CM

## 2019-09-09 DIAGNOSIS — J310 Chronic rhinitis: Secondary | ICD-10-CM

## 2019-09-09 MED ORDER — XHANCE 93 MCG/ACT NA EXHU: 2.0000 | INHALANT_SUSPENSION | Freq: Two times a day (BID) | NASAL | 5 refills | Status: DC

## 2019-09-09 NOTE — Assessment & Plan Note (Signed)
History of asthma as a child and exercise-induced asthma as a young adult.  No inhaler use for many years.  Today's spirometry was normal.  Monitor symptoms.

## 2019-09-09 NOTE — Patient Instructions (Addendum)
Today's skin testing showed: Negative to indoor/outdoor allergens. Negative to common foods.    Start Xhance 2 sprays per nostril twice a day for nasal congestion.   Demonstrated proper use and sample given.   Nasal saline spray (i.e., Simply Saline) or nasal saline lavage (i.e., NeilMed) is recommended as needed and prior to medicated nasal sprays.   Today's breathing test showed: normal.  Follow up in 2 months or sooner if needed.

## 2019-09-09 NOTE — Assessment & Plan Note (Signed)
Perennial rhinoconjunctivitis symptoms for the past 10 years with worsening in the spring and fall.  Tried Claritin, Benadryl, over-the-counter decongestant and Flonase with some benefit.  No previous allergy testing.  Patient had ENT evaluation over 10 years ago and was told he had a polyp.  No prior nasal surgeries.  Today's skin testing showed: Negative to indoor/outdoor allergens. Negative to common foods.   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  Start Xhance 2 sprays per nostril twice a day for nasal congestion.   Demonstrated proper use and sample given.   Nasal saline spray (i.e., Simply Saline) or nasal saline lavage (i.e., NeilMed) is recommended as needed and prior to medicated nasal sprays.  If above regimen does not improve symptoms then will refer to ENT next.

## 2019-09-09 NOTE — Assessment & Plan Note (Signed)
   See assessment and plan as above for chronic rhinitis.  

## 2019-09-09 NOTE — Progress Notes (Signed)
New Patient Note  RE: Marc Hayden MRN: [id number] DOB: 1971/01/06 Date of Office Visit: 09/09/2019  Referring provider: Lewis Moccasinewey, Elizabeth R, MD Primary care provider: Lewis Moccasinewey, Elizabeth R, MD  Chief Complaint: Nasal Congestion (stuffy head congestion every morning)  History of Present Illness: I had the pleasure of seeing Marc Hayden for initial evaluation at the [facility] on 09/09/2019. He is a 49 y.o. male, who is referred here by Lewis Moccasinewey, Elizabeth R, MD for the evaluation of allergies.  He reports symptoms of nasal congestion, PND, cloudy headaches, difficulty sleeping, itchy/watery eyes, snoring. Symptoms have been going on for 10 years. The symptoms are present all year around with worsening in spring and fall. Other triggers include exposure to unknown. Anosmia: decreased. Headache: yes. He has used Claritin, benadryl, OTC decongestant, Flonase with some improvement in symptoms. Sinus infections: not recently. Previous work up includes: no. Previous ENT evaluation: not recently. Previous sinus imaging: no. History of nasal polyps: yes but no surgical procedure. Last eye exam: over 3 years ago. History of reflux: no.  Assessment and Plan: Marc Hayden is a 49 y.o. male with: Chronic rhinitis Perennial rhinoconjunctivitis symptoms for the past 10 years with worsening in the spring and fall.  Tried Claritin, Benadryl, over-the-counter decongestant and Flonase with some benefit.  No previous allergy testing.  Patient had ENT evaluation over 10 years ago and was told he had a polyp.  No prior nasal surgeries.  Today's skin testing showed: Negative to indoor/outdoor allergens. Negative to common foods.   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  Start Xhance 2 sprays per nostril twice a day for nasal congestion.   Demonstrated proper use and sample given.   Nasal saline spray (i.e., Simply Saline) or nasal saline lavage (i.e., NeilMed) is recommended  as needed and prior to medicated nasal sprays.  If above regimen does not improve symptoms then will refer to ENT next.  History of nasal polyp  See assessment and plan as above for chronic rhinitis.  History of asthma History of asthma as a child and exercise-induced asthma as a young adult.  No inhaler use for many years.  Today's spirometry was normal.  Monitor symptoms.  Return in about 2 months (around 11/09/2019).  Meds ordered this encounter  Medications  . Fluticasone Propionate (XHANCE) 93 MCG/ACT EXHU    Sig: Place 2 sprays into the nose 2 (two) times daily.    Dispense:  16 mL    Refill:  5   Other allergy screening: Asthma:  As a child had an inhaler and had exercise induced asthma as an adult.  Food allergy: no Medication allergy: no Hymenoptera allergy: no Urticaria: no Eczema:no History of recurrent infections suggestive of immunodeficency: no  Diagnostics: Spirometry:  Tracings reviewed. His effort: Good reproducible efforts. FVC: 5.30L FEV1: 4.44L, 104% predicted FEV1/FVC ratio: 84% Interpretation: Spirometry consistent with normal pattern.  Please see scanned spirometry results for details.  Skin Testing: Environmental allergy panel and select foods. Negative test to: environmental allergy panel and basic common foods.  Results discussed with patient/family. Airborne Adult Perc - 09/09/19 0919    Time Antigen Placed  0919    Allergen Manufacturer  Waynette ButteryGreer    Location  Back    Number of Test  59    Panel 1  Select    1. Control-Buffer 50% Glycerol  Negative    2. Control-Histamine 1 mg/ml  2+    3. Albumin saline  Negative    4.  Bahia  Negative    5. French Southern Territories  Negative    6. Johnson  Negative    7. Kentucky Blue  Negative    8. Meadow Fescue  Negative    9. Perennial Rye  Negative    10. Sweet Vernal  Negative    11. Timothy  Negative    12. Cocklebur  Negative    13. Burweed Marshelder  Negative    14. Ragweed, short  Negative    15.  Ragweed, Giant  Negative    16. Plantain,  English  Negative    17. Lamb's Quarters  Negative    18. Sheep Sorrell  Negative    19. Rough Pigweed  Negative    20. Marsh Elder, Rough  Negative    21. Mugwort, Common  Negative    22. Ash mix  Negative    23. Birch mix  Negative    24. Beech American  Negative    25. Box, Elder  Negative    26. Cedar, red  Negative    27. Cottonwood, Guinea-Bissau  Negative    28. Elm mix  Negative    29. Hickory  Negative    30. Maple mix  Negative    31. Oak, Guinea-Bissau mix  Negative    32. Pecan Pollen  Negative    33. Pine mix  Negative    34. Sycamore Eastern  Negative    35. Walnut, Black Pollen  Negative    36. Alternaria alternata  Negative    37. Cladosporium Herbarum  Negative    38. Aspergillus mix  Negative    39. Penicillium mix  Negative    40. Bipolaris sorokiniana (Helminthosporium)  Negative    41. Drechslera spicifera (Curvularia)  Negative    42. Mucor plumbeus  Negative    43. Fusarium moniliforme  Negative    44. Aureobasidium pullulans (pullulara)  Negative    45. Rhizopus oryzae  Negative    46. Botrytis cinera  Negative    47. Epicoccum nigrum  Negative    48. Phoma betae  Negative    49. Candida Albicans  Negative    50. Trichophyton mentagrophytes  Negative    51. Mite, D Farinae  5,000 AU/ml  Negative    52. Mite, D Pteronyssinus  5,000 AU/ml  Negative    53. Cat Hair 10,000 BAU/ml  Negative    54.  Dog Epithelia  Negative    55. Mixed Feathers  Negative    56. Horse Epithelia  Negative    57. Cockroach, German  Negative    58. Mouse  Negative    59. Tobacco Leaf  Negative     Intradermal - 09/09/19 0958    Time Antigen Placed  3007    Allergen Manufacturer  Waynette Buttery    Location  Arm    Number of Test  15    Intradermal  Select    Control  Negative    French Southern Territories  Negative    Johnson  Negative    7 Grass  Negative    Ragweed mix  Negative    Weed mix  Negative    Tree mix  Negative    Mold 1  Negative    Mold 2   Negative    Mold 3  Negative    Mold 4  Negative    Cat  Negative    Dog  Negative    Cockroach  Negative    Mite mix  Negative  Food Adult Perc - 09/09/19 0900    Time Antigen Placed  3716    Allergen Manufacturer  Waynette Buttery    Location  Back    Number of allergen test  10    1. Peanut  Negative    2. Soybean  Negative    3. Wheat  Negative    4. Sesame  Negative    5. Milk, cow  Negative    6. Egg White, Chicken  Negative    7. Casein  Negative    8. Shellfish Mix  Negative    9. Fish Mix  Negative    10. Cashew  Negative       Past Medical History: Patient Active Problem List   Diagnosis Date Noted  . History of nasal polyp 09/09/2019  . Chronic rhinitis 09/09/2019  . History of asthma 09/09/2019  . Localization-related idiopathic epilepsy and epileptic syndromes with seizures of localized onset, not intractable, without status epilepticus (HCC) 02/29/2016  . Acidosis 02/21/2016  . Convulsions/seizures (HCC) 01/24/2014   Past Medical History:  Diagnosis Date  . Asthma   . Seizures (HCC) 2003   Past Surgical History: Past Surgical History:  Procedure Laterality Date  . KNEE ARTHROSCOPY Right    Medication List:  Current Outpatient Medications  Medication Sig Dispense Refill  . azelastine (ASTELIN) 0.1 % nasal spray Place into both nostrils as needed. Use in each nostril as directed    . ciprofloxacin (CIPRO) 500 MG tablet Take 500 mg by mouth 2 (two) times daily.    . diphenhydrAMINE (BENADRYL) 25 MG tablet Take 25 mg by mouth every 6 (six) hours as needed for itching or allergies.    . finasteride (PROPECIA) 1 MG tablet Take 1 mg by mouth daily.    . hydrochlorothiazide (HYDRODIURIL) 25 MG tablet Take 25 mg by mouth daily.    Marland Kitchen lamoTRIgine (LAMICTAL) 200 MG tablet Take 1 tablet (200 mg total) by mouth 2 (two) times daily. 60 tablet 11  . lisinopril (PRINIVIL,ZESTRIL) 40 MG tablet Take 40 mg by mouth daily.  3  . meloxicam (MOBIC) 15 MG tablet Take 1 tablet  (15 mg total) by mouth daily. 30 tablet 1  . Midazolam 5 MG/0.1ML SOLN Use 1 spray into one nostril for seizure and call 911. May repeat one dose after 10 minutes in other nostril. Max 2 doses per single episode every 3 days up to 5 episodes per month. 2 each 5  . sildenafil (VIAGRA) 100 MG tablet     . Fluticasone Propionate (XHANCE) 93 MCG/ACT EXHU Place 2 sprays into the nose 2 (two) times daily. 16 mL 5  . loratadine (CLARITIN) 10 MG tablet Take 10 mg by mouth daily.    . predniSONE (STERAPRED UNI-PAK 21 TAB) 10 MG (21) TBPK tablet Take as directed 21 tablet 0  . rosuvastatin (CRESTOR) 5 MG tablet Take 5 mg by mouth daily.     No current facility-administered medications for this visit.   Facility-Administered Medications Ordered in Other Visits  Medication Dose Route Frequency Provider Last Rate Last Admin  . gadopentetate dimeglumine (MAGNEVIST) injection 20 mL  20 mL Intravenous Once PRN Anson Fret, MD       Allergies: No Known Allergies Social History: Social History   Socioeconomic History  . Marital status: Married    Spouse name: Alyce  . Number of children: 2  . Years of education: Grad Sch  . Highest education level: Not on file  Occupational History  Employer: [employer]    Comment: [employer]  Tobacco Use  . Smoking status: Never Smoker  . Smokeless tobacco: Never Used  Substance and Sexual Activity  . Alcohol use: Yes    Comment: 2-3 drinks per week  . Drug use: No  . Sexual activity: Not on file  Other Topics Concern  . Not on file  Social History Narrative   Patient lives at home with his spouse.   Caffeine Use: 2 cups daily   Social Determinants of Health   Financial Resource Strain:   . Difficulty of Paying Living Expenses:   Food Insecurity:   . Worried About Programme researcher, broadcasting/film/video in the Last Year:   . Barista in the Last Year:   Transportation Needs:   . Freight forwarder (Medical):   Marland Kitchen Lack of Transportation  (Non-Medical):   Physical Activity:   . Days of Exercise per Week:   . Minutes of Exercise per Session:   Stress:   . Feeling of Stress :   Social Connections:   . Frequency of Communication with Friends and Family:   . Frequency of Social Gatherings with Friends and Family:   . Attends Religious Services:   . Active Member of Clubs or Organizations:   . Attends Banker Meetings:   Marland Kitchen Marital Status:    Lives in a 48 year old home. Smoking: denies Occupation: Doctor, hospital HistorySurveyor, minerals in the house: no Engineer, civil (consulting) in the family room: no Carpet in the bedroom: yes Heating: gas Cooling: central Pet: yes 1 dog x 15 years  Family History: Family History  Problem Relation Age of Onset  . High blood pressure Mother   . Diabetes Mother   . Heart failure Mother   . Allergic rhinitis Mother   . Lung cancer Father   . Allergic rhinitis Father   . Allergic rhinitis Brother   . Seizures Neg Hx   . Asthma Neg Hx   . Eczema Neg Hx   . Urticaria Neg Hx    Review of Systems  Constitutional: Negative for appetite change, chills, fever and unexpected weight change.  HENT: Positive for congestion, postnasal drip and sinus pressure. Negative for rhinorrhea.   Eyes: Negative for itching.  Respiratory: Negative for cough, chest tightness, shortness of breath and wheezing.   Cardiovascular: Negative for chest pain.  Gastrointestinal: Negative for abdominal pain.  Genitourinary: Negative for difficulty urinating.  Skin: Negative for rash.  Neurological: Positive for headaches.   Objective: BP 124/76 (BP Location: Right Arm, Patient Position: Sitting, Cuff Size: Normal)   Pulse (!) 102   Temp (!) 97.3 F (36.3 C) (Temporal)   Resp 16   Ht 6' 2.49" (1.892 m)   Wt 273 lb 12.8 oz (124.2 kg)   SpO2 97%   BMI 34.69 kg/m  Body mass index is 34.69 kg/m. Physical Exam  Constitutional: He is oriented to person, place, and time. He appears  well-developed and well-nourished.  HENT:  Head: Normocephalic and atraumatic.  Right Ear: External ear normal.  Left Ear: External ear normal.  Mouth/Throat: Oropharynx is clear and moist.  Right side nasal turbinate hypertrophy.   Eyes: Conjunctivae and EOM are normal.  Cardiovascular: Normal rate, regular rhythm and normal heart sounds. Exam reveals no gallop and no friction rub.  No murmur heard. Pulmonary/Chest: Effort normal and breath sounds normal. He has no wheezes. He has no rales.  Abdominal: Soft.  Musculoskeletal:  Cervical back: Neck supple.  Neurological: He is alert and oriented to person, place, and time.  Skin: Skin is warm. No rash noted.  Psychiatric: He has a normal mood and affect. His behavior is normal.  Nursing note and vitals reviewed.  The plan was reviewed with the patient/family, and all questions/concerned were addressed.  It was my pleasure to see Marc Hayden today and participate in his care. Please feel free to contact me with any questions or concerns.  Sincerely,  Rexene Alberts, DO Allergy & Immunology  [facility] office: [phone] [facility] office: [place] office: [phone]

## 2019-09-20 DIAGNOSIS — M9902 Segmental and somatic dysfunction of thoracic region: Secondary | ICD-10-CM | POA: Diagnosis not present

## 2019-09-20 DIAGNOSIS — M9903 Segmental and somatic dysfunction of lumbar region: Secondary | ICD-10-CM | POA: Diagnosis not present

## 2019-09-20 DIAGNOSIS — M9901 Segmental and somatic dysfunction of cervical region: Secondary | ICD-10-CM | POA: Diagnosis not present

## 2019-09-20 DIAGNOSIS — M9905 Segmental and somatic dysfunction of pelvic region: Secondary | ICD-10-CM | POA: Diagnosis not present

## 2019-09-23 DIAGNOSIS — M9901 Segmental and somatic dysfunction of cervical region: Secondary | ICD-10-CM | POA: Diagnosis not present

## 2019-09-23 DIAGNOSIS — M9902 Segmental and somatic dysfunction of thoracic region: Secondary | ICD-10-CM | POA: Diagnosis not present

## 2019-09-23 DIAGNOSIS — M9905 Segmental and somatic dysfunction of pelvic region: Secondary | ICD-10-CM | POA: Diagnosis not present

## 2019-09-23 DIAGNOSIS — M9903 Segmental and somatic dysfunction of lumbar region: Secondary | ICD-10-CM | POA: Diagnosis not present

## 2019-10-04 ENCOUNTER — Telehealth: Payer: Self-pay

## 2019-10-04 NOTE — Telephone Encounter (Signed)
Xhance approved for coverage by insurance.

## 2019-11-23 NOTE — Progress Notes (Deleted)
Follow Up Note  RE: Marc Hayden MRN: [id number] DOB: Sep 19, 1970 Date of Office Visit: 11/24/2019  Referring provider: Lewis Moccasin, MD Primary care provider: Lewis Moccasin, MD  Chief Complaint: No chief complaint on file.  History of Present Illness: I had the pleasure of seeing Marc Hayden for a follow up visit at the [facility] on 11/23/2019. He is a 49 y.o. male, who is being followed for chronic rhinitis, history of nasal polyp and history of asthma. His previous allergy office visit was on 09/09/2019 with Dr. Selena Batten. Today is a regular follow up visit.  Chronic rhinitis Perennial rhinoconjunctivitis symptoms for the past 10 years with worsening in the spring and fall.  Tried Claritin, Benadryl, over-the-counter decongestant and Flonase with some benefit.  No previous allergy testing.  Patient had ENT evaluation over 10 years ago and was told he had a polyp.  No prior nasal surgeries.  Today's skin testing showed: Negative to indoor/outdoor allergens. Negative to common foods.   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  Start Xhance 2 sprays per nostril twice a day for nasal congestion.  ? Demonstrated proper use and sample given.   Nasal saline spray (i.e., Simply Saline) or nasal saline lavage (i.e., NeilMed) is recommended as needed and prior to medicated nasal sprays.  If above regimen does not improve symptoms then will refer to ENT next.  History of nasal polyp  See assessment and plan as above for chronic rhinitis.  History of asthma History of asthma as a child and exercise-induced asthma as a young adult.  No inhaler use for many years.  Today's spirometry was normal.  Monitor symptoms.  Return in about 2 months (around 11/09/2019).  Assessment and Plan: Marc Hayden is a 49 y.o. male with: No problem-specific Assessment & Plan notes found for this encounter.  No follow-ups on file.  No orders of the defined  types were placed in this encounter.  Lab Orders  No laboratory test(s) ordered today    Diagnostics: Spirometry:  Tracings reviewed. His effort: {Blank single:19197::"Good reproducible efforts.","It was hard to get consistent efforts and there is a question as to whether this reflects a maximal maneuver.","Poor effort, data can not be interpreted."} FVC: ***L FEV1: ***L, ***% predicted FEV1/FVC ratio: ***% Interpretation: {Blank single:19197::"Spirometry consistent with mild obstructive disease","Spirometry consistent with moderate obstructive disease","Spirometry consistent with severe obstructive disease","Spirometry consistent with possible restrictive disease","Spirometry consistent with mixed obstructive and restrictive disease","Spirometry uninterpretable due to technique","Spirometry consistent with normal pattern","No overt abnormalities noted given today's efforts"}.  Please see scanned spirometry results for details.  Skin Testing: {Blank single:19197::"Select foods","Environmental allergy panel","Environmental allergy panel and select foods","Food allergy panel","None","Deferred due to recent antihistamines use"}. Positive test to: ***. Negative test to: ***.  Results discussed with patient/family.   Medication List:  Current Outpatient Medications  Medication Sig Dispense Refill  . azelastine (ASTELIN) 0.1 % nasal spray Place into both nostrils as needed. Use in each nostril as directed    . ciprofloxacin (CIPRO) 500 MG tablet Take 500 mg by mouth 2 (two) times daily.    . diphenhydrAMINE (BENADRYL) 25 MG tablet Take 25 mg by mouth every 6 (six) hours as needed for itching or allergies.    . finasteride (PROPECIA) 1 MG tablet Take 1 mg by mouth daily.    . Fluticasone Propionate (XHANCE) 93 MCG/ACT EXHU Place 2 sprays into the nose 2 (two) times daily. 16 mL 5  . hydrochlorothiazide (HYDRODIURIL) 25 MG tablet Take 25  mg by mouth daily.    Marland Kitchen lamoTRIgine (LAMICTAL) 200 MG  tablet Take 1 tablet (200 mg total) by mouth 2 (two) times daily. 60 tablet 11  . lisinopril (PRINIVIL,ZESTRIL) 40 MG tablet Take 40 mg by mouth daily.  3  . loratadine (CLARITIN) 10 MG tablet Take 10 mg by mouth daily.    . meloxicam (MOBIC) 15 MG tablet Take 1 tablet (15 mg total) by mouth daily. 30 tablet 1  . Midazolam 5 MG/0.1ML SOLN Use 1 spray into one nostril for seizure and call 911. May repeat one dose after 10 minutes in other nostril. Max 2 doses per single episode every 3 days up to 5 episodes per month. 2 each 5  . predniSONE (STERAPRED UNI-PAK 21 TAB) 10 MG (21) TBPK tablet Take as directed 21 tablet 0  . rosuvastatin (CRESTOR) 5 MG tablet Take 5 mg by mouth daily.    . sildenafil (VIAGRA) 100 MG tablet      No current facility-administered medications for this visit.   Facility-Administered Medications Ordered in Other Visits  Medication Dose Route Frequency Provider Last Rate Last Admin  . gadopentetate dimeglumine (MAGNEVIST) injection 20 mL  20 mL Intravenous Once PRN Anson Fret, MD       Allergies: No Known Allergies I reviewed his past medical history, social history, family history, and environmental history and no significant changes have been reported from his previous visit.  Review of Systems  Constitutional: Negative for appetite change, chills, fever and unexpected weight change.  HENT: Positive for congestion, postnasal drip and sinus pressure. Negative for rhinorrhea.   Eyes: Negative for itching.  Respiratory: Negative for cough, chest tightness, shortness of breath and wheezing.   Cardiovascular: Negative for chest pain.  Gastrointestinal: Negative for abdominal pain.  Genitourinary: Negative for difficulty urinating.  Skin: Negative for rash.  Neurological: Positive for headaches.   Objective: There were no vitals taken for this visit. There is no height or weight on file to calculate BMI. Physical Exam Vitals and nursing note reviewed.    Constitutional:      Appearance: He is well-developed.  HENT:     Head: Normocephalic and atraumatic.     Right Ear: External ear normal.     Left Ear: External ear normal.     Nose: Nose normal.     Mouth/Throat:     Mouth: Mucous membranes are moist.     Pharynx: Oropharynx is clear.  Eyes:     Conjunctiva/sclera: Conjunctivae normal.  Cardiovascular:     Rate and Rhythm: Normal rate and regular rhythm.     Heart sounds: Normal heart sounds. No murmur heard.  No friction rub. No gallop.   Pulmonary:     Effort: Pulmonary effort is normal.     Breath sounds: Normal breath sounds. No wheezing or rales.  Musculoskeletal:     Cervical back: Neck supple.  Skin:    General: Skin is warm.     Findings: No rash.  Neurological:     Mental Status: He is alert and oriented to person, place, and time.  Psychiatric:        Behavior: Behavior normal.    Previous notes and tests were reviewed. The plan was reviewed with the patient/family, and all questions/concerned were addressed.  It was my pleasure to see Marc Hayden today and participate in his care. Please feel free to contact me with any questions or concerns.  Sincerely,  Wyline Mood, DO Allergy & Immunology  [facility] office: [phone] [facility] office: [place] office: [phone]

## 2019-11-24 ENCOUNTER — Ambulatory Visit: Payer: BC Managed Care – PPO | Admitting: Allergy

## 2019-11-29 DIAGNOSIS — K5790 Diverticulosis of intestine, part unspecified, without perforation or abscess without bleeding: Secondary | ICD-10-CM | POA: Diagnosis not present

## 2019-11-29 DIAGNOSIS — K589 Irritable bowel syndrome without diarrhea: Secondary | ICD-10-CM | POA: Diagnosis not present

## 2019-11-29 DIAGNOSIS — K219 Gastro-esophageal reflux disease without esophagitis: Secondary | ICD-10-CM | POA: Diagnosis not present

## 2019-11-29 DIAGNOSIS — N200 Calculus of kidney: Secondary | ICD-10-CM | POA: Diagnosis not present

## 2020-01-06 ENCOUNTER — Encounter: Payer: Self-pay | Admitting: Gastroenterology

## 2020-01-20 DIAGNOSIS — Z0000 Encounter for general adult medical examination without abnormal findings: Secondary | ICD-10-CM | POA: Diagnosis not present

## 2020-01-20 DIAGNOSIS — Z1159 Encounter for screening for other viral diseases: Secondary | ICD-10-CM | POA: Diagnosis not present

## 2020-01-24 DIAGNOSIS — E782 Mixed hyperlipidemia: Secondary | ICD-10-CM | POA: Diagnosis not present

## 2020-01-24 DIAGNOSIS — G44209 Tension-type headache, unspecified, not intractable: Secondary | ICD-10-CM | POA: Diagnosis not present

## 2020-01-24 DIAGNOSIS — K589 Irritable bowel syndrome without diarrhea: Secondary | ICD-10-CM | POA: Diagnosis not present

## 2020-01-24 DIAGNOSIS — I10 Essential (primary) hypertension: Secondary | ICD-10-CM | POA: Diagnosis not present

## 2020-01-26 DIAGNOSIS — R739 Hyperglycemia, unspecified: Secondary | ICD-10-CM | POA: Diagnosis not present

## 2020-01-26 DIAGNOSIS — Z0000 Encounter for general adult medical examination without abnormal findings: Secondary | ICD-10-CM | POA: Diagnosis not present

## 2020-01-26 DIAGNOSIS — Z23 Encounter for immunization: Secondary | ICD-10-CM | POA: Diagnosis not present

## 2020-01-26 DIAGNOSIS — M722 Plantar fascial fibromatosis: Secondary | ICD-10-CM | POA: Diagnosis not present

## 2020-02-16 ENCOUNTER — Ambulatory Visit (AMBULATORY_SURGERY_CENTER): Payer: BC Managed Care – PPO

## 2020-02-16 ENCOUNTER — Other Ambulatory Visit: Payer: Self-pay

## 2020-02-16 VITALS — Ht 74.0 in | Wt 268.0 lb

## 2020-02-16 DIAGNOSIS — Z1211 Encounter for screening for malignant neoplasm of colon: Secondary | ICD-10-CM

## 2020-02-16 NOTE — Progress Notes (Signed)
Pre visit completed via phone-patient's name, DOB, and address verified; No egg or soy allergy known to patient  No issues with past sedation with any surgeries or procedures No intubation problems in the past  No FH of Malignant Hyperthermia No diet pills per patient No home 02 use per patient  No blood thinners per patient  Pt reports issues with constipation ---will advise Miralax 5 days prior to prep No A fib or A flutter  EMMI video via MyChart  COVID 19 guidelines implemented in [facility] today with Pt and RN  Coupon given to pt in PV today , Code to Pharmacy  COVID vaccines completed on 07/2019 per pt;  Due to the COVID-19 pandemic we are asking patients to follow these guidelines. Please only bring one care partner. Please be aware that your care partner may wait in the car in the parking lot or if they feel like they will be too hot to wait in the car, they may wait in the lobby on the 4th floor. All care partners are required to wear a mask the entire time (we do not have any that we can provide them), they need to practice social distancing, and we will do a Covid check for all patient's and care partners when you arrive. Also we will check their temperature and your temperature. If the care partner waits in their car they need to stay in the parking lot the entire time and we will call them on their cell phone when the patient is ready for discharge so they can bring the car to the front of the building. Also all patient's will need to wear a mask into building.

## 2020-02-17 ENCOUNTER — Encounter: Payer: Self-pay | Admitting: Gastroenterology

## 2020-03-07 ENCOUNTER — Encounter: Payer: BC Managed Care – PPO | Admitting: Gastroenterology

## 2020-05-09 DIAGNOSIS — Z20822 Contact with and (suspected) exposure to covid-19: Secondary | ICD-10-CM | POA: Diagnosis not present

## 2020-07-24 DIAGNOSIS — R739 Hyperglycemia, unspecified: Secondary | ICD-10-CM | POA: Diagnosis not present

## 2020-07-24 DIAGNOSIS — E663 Overweight: Secondary | ICD-10-CM | POA: Diagnosis not present

## 2020-07-24 DIAGNOSIS — I10 Essential (primary) hypertension: Secondary | ICD-10-CM | POA: Diagnosis not present

## 2020-07-24 DIAGNOSIS — E782 Mixed hyperlipidemia: Secondary | ICD-10-CM | POA: Diagnosis not present

## 2020-07-24 DIAGNOSIS — E1159 Type 2 diabetes mellitus with other circulatory complications: Secondary | ICD-10-CM | POA: Diagnosis not present

## 2020-07-24 DIAGNOSIS — K589 Irritable bowel syndrome without diarrhea: Secondary | ICD-10-CM | POA: Diagnosis not present

## 2020-09-21 DIAGNOSIS — B9689 Other specified bacterial agents as the cause of diseases classified elsewhere: Secondary | ICD-10-CM | POA: Diagnosis not present

## 2020-09-21 DIAGNOSIS — J019 Acute sinusitis, unspecified: Secondary | ICD-10-CM | POA: Diagnosis not present

## 2020-10-23 DIAGNOSIS — E785 Hyperlipidemia, unspecified: Secondary | ICD-10-CM | POA: Diagnosis not present

## 2020-10-23 DIAGNOSIS — R739 Hyperglycemia, unspecified: Secondary | ICD-10-CM | POA: Diagnosis not present

## 2020-10-23 DIAGNOSIS — I10 Essential (primary) hypertension: Secondary | ICD-10-CM | POA: Diagnosis not present

## 2020-10-25 DIAGNOSIS — M545 Low back pain, unspecified: Secondary | ICD-10-CM | POA: Diagnosis not present

## 2020-10-25 DIAGNOSIS — I10 Essential (primary) hypertension: Secondary | ICD-10-CM | POA: Diagnosis not present

## 2020-10-25 DIAGNOSIS — E782 Mixed hyperlipidemia: Secondary | ICD-10-CM | POA: Diagnosis not present

## 2020-10-25 DIAGNOSIS — R739 Hyperglycemia, unspecified: Secondary | ICD-10-CM | POA: Diagnosis not present

## 2020-12-18 DIAGNOSIS — J329 Chronic sinusitis, unspecified: Secondary | ICD-10-CM | POA: Diagnosis not present

## 2020-12-18 DIAGNOSIS — B9689 Other specified bacterial agents as the cause of diseases classified elsewhere: Secondary | ICD-10-CM | POA: Diagnosis not present

## 2021-01-29 DIAGNOSIS — Z125 Encounter for screening for malignant neoplasm of prostate: Secondary | ICD-10-CM | POA: Diagnosis not present

## 2021-01-29 DIAGNOSIS — Z0000 Encounter for general adult medical examination without abnormal findings: Secondary | ICD-10-CM | POA: Diagnosis not present

## 2021-01-31 DIAGNOSIS — E1165 Type 2 diabetes mellitus with hyperglycemia: Secondary | ICD-10-CM | POA: Diagnosis not present

## 2021-01-31 DIAGNOSIS — Z1211 Encounter for screening for malignant neoplasm of colon: Secondary | ICD-10-CM | POA: Diagnosis not present

## 2021-01-31 DIAGNOSIS — E669 Obesity, unspecified: Secondary | ICD-10-CM | POA: Diagnosis not present

## 2021-01-31 DIAGNOSIS — Z23 Encounter for immunization: Secondary | ICD-10-CM | POA: Diagnosis not present

## 2021-01-31 DIAGNOSIS — I10 Essential (primary) hypertension: Secondary | ICD-10-CM | POA: Diagnosis not present

## 2021-01-31 DIAGNOSIS — E782 Mixed hyperlipidemia: Secondary | ICD-10-CM | POA: Diagnosis not present

## 2021-01-31 DIAGNOSIS — R739 Hyperglycemia, unspecified: Secondary | ICD-10-CM | POA: Diagnosis not present

## 2021-01-31 DIAGNOSIS — Z0000 Encounter for general adult medical examination without abnormal findings: Secondary | ICD-10-CM | POA: Diagnosis not present

## 2021-02-01 ENCOUNTER — Other Ambulatory Visit: Payer: Self-pay | Admitting: Neurology

## 2021-03-08 DIAGNOSIS — Z1211 Encounter for screening for malignant neoplasm of colon: Secondary | ICD-10-CM | POA: Diagnosis not present

## 2021-03-08 DIAGNOSIS — G40909 Epilepsy, unspecified, not intractable, without status epilepticus: Secondary | ICD-10-CM | POA: Diagnosis not present

## 2021-03-08 DIAGNOSIS — I10 Essential (primary) hypertension: Secondary | ICD-10-CM | POA: Diagnosis not present

## 2021-03-08 DIAGNOSIS — E785 Hyperlipidemia, unspecified: Secondary | ICD-10-CM | POA: Diagnosis not present

## 2021-03-08 DIAGNOSIS — K5900 Constipation, unspecified: Secondary | ICD-10-CM | POA: Diagnosis not present

## 2021-03-13 DIAGNOSIS — R69 Illness, unspecified: Secondary | ICD-10-CM | POA: Diagnosis not present

## 2021-03-13 DIAGNOSIS — E1165 Type 2 diabetes mellitus with hyperglycemia: Secondary | ICD-10-CM | POA: Diagnosis not present

## 2021-03-13 DIAGNOSIS — G4700 Insomnia, unspecified: Secondary | ICD-10-CM | POA: Diagnosis not present

## 2021-03-13 DIAGNOSIS — I10 Essential (primary) hypertension: Secondary | ICD-10-CM | POA: Diagnosis not present

## 2021-03-13 DIAGNOSIS — E782 Mixed hyperlipidemia: Secondary | ICD-10-CM | POA: Diagnosis not present

## 2021-04-10 ENCOUNTER — Other Ambulatory Visit (HOSPITAL_COMMUNITY): Payer: Self-pay

## 2021-04-10 DIAGNOSIS — U071 COVID-19: Secondary | ICD-10-CM | POA: Diagnosis not present

## 2021-04-10 MED ORDER — NIRMATRELVIR/RITONAVIR (PAXLOVID) TABLET (RENAL DOSING)
ORAL_TABLET | Freq: Two times a day (BID) | ORAL | 0 refills | Status: AC
  Filled 2021-04-10: qty 20, 5d supply, fill #0

## 2021-04-11 ENCOUNTER — Other Ambulatory Visit (HOSPITAL_COMMUNITY): Payer: Self-pay

## 2021-06-25 DIAGNOSIS — J309 Allergic rhinitis, unspecified: Secondary | ICD-10-CM | POA: Diagnosis not present

## 2021-06-25 DIAGNOSIS — M1711 Unilateral primary osteoarthritis, right knee: Secondary | ICD-10-CM | POA: Diagnosis not present

## 2021-06-25 DIAGNOSIS — E1165 Type 2 diabetes mellitus with hyperglycemia: Secondary | ICD-10-CM | POA: Diagnosis not present

## 2021-06-25 DIAGNOSIS — G4700 Insomnia, unspecified: Secondary | ICD-10-CM | POA: Diagnosis not present

## 2021-06-25 DIAGNOSIS — I10 Essential (primary) hypertension: Secondary | ICD-10-CM | POA: Diagnosis not present

## 2021-09-03 DIAGNOSIS — G4700 Insomnia, unspecified: Secondary | ICD-10-CM | POA: Diagnosis not present

## 2021-09-03 DIAGNOSIS — J309 Allergic rhinitis, unspecified: Secondary | ICD-10-CM | POA: Diagnosis not present

## 2021-09-03 DIAGNOSIS — I10 Essential (primary) hypertension: Secondary | ICD-10-CM | POA: Diagnosis not present

## 2021-09-18 DIAGNOSIS — E785 Hyperlipidemia, unspecified: Secondary | ICD-10-CM | POA: Diagnosis not present

## 2021-09-18 DIAGNOSIS — E669 Obesity, unspecified: Secondary | ICD-10-CM | POA: Diagnosis not present

## 2021-09-18 DIAGNOSIS — I10 Essential (primary) hypertension: Secondary | ICD-10-CM | POA: Diagnosis not present

## 2021-09-18 DIAGNOSIS — Z1211 Encounter for screening for malignant neoplasm of colon: Secondary | ICD-10-CM | POA: Diagnosis not present

## 2021-09-18 DIAGNOSIS — E119 Type 2 diabetes mellitus without complications: Secondary | ICD-10-CM | POA: Diagnosis not present

## 2021-10-10 DIAGNOSIS — G40909 Epilepsy, unspecified, not intractable, without status epilepticus: Secondary | ICD-10-CM | POA: Diagnosis not present

## 2021-10-10 DIAGNOSIS — R69 Illness, unspecified: Secondary | ICD-10-CM | POA: Diagnosis not present

## 2021-10-10 DIAGNOSIS — G4700 Insomnia, unspecified: Secondary | ICD-10-CM | POA: Diagnosis not present

## 2021-10-24 DIAGNOSIS — R69 Illness, unspecified: Secondary | ICD-10-CM | POA: Diagnosis not present

## 2021-10-31 DIAGNOSIS — R69 Illness, unspecified: Secondary | ICD-10-CM | POA: Diagnosis not present

## 2021-11-07 DIAGNOSIS — R69 Illness, unspecified: Secondary | ICD-10-CM | POA: Diagnosis not present

## 2021-11-08 DIAGNOSIS — G4700 Insomnia, unspecified: Secondary | ICD-10-CM | POA: Diagnosis not present

## 2021-11-08 DIAGNOSIS — R69 Illness, unspecified: Secondary | ICD-10-CM | POA: Diagnosis not present

## 2021-11-08 DIAGNOSIS — G40909 Epilepsy, unspecified, not intractable, without status epilepticus: Secondary | ICD-10-CM | POA: Diagnosis not present

## 2021-11-12 DIAGNOSIS — Z1331 Encounter for screening for depression: Secondary | ICD-10-CM | POA: Diagnosis not present

## 2021-11-12 DIAGNOSIS — F439 Reaction to severe stress, unspecified: Secondary | ICD-10-CM | POA: Diagnosis not present

## 2021-11-12 DIAGNOSIS — R69 Illness, unspecified: Secondary | ICD-10-CM | POA: Diagnosis not present

## 2021-11-19 DIAGNOSIS — F439 Reaction to severe stress, unspecified: Secondary | ICD-10-CM | POA: Diagnosis not present

## 2021-11-19 DIAGNOSIS — I10 Essential (primary) hypertension: Secondary | ICD-10-CM | POA: Diagnosis not present

## 2021-11-19 DIAGNOSIS — G40909 Epilepsy, unspecified, not intractable, without status epilepticus: Secondary | ICD-10-CM | POA: Diagnosis not present

## 2021-11-19 DIAGNOSIS — R69 Illness, unspecified: Secondary | ICD-10-CM | POA: Diagnosis not present

## 2021-11-19 DIAGNOSIS — G4700 Insomnia, unspecified: Secondary | ICD-10-CM | POA: Diagnosis not present

## 2021-11-20 DIAGNOSIS — R69 Illness, unspecified: Secondary | ICD-10-CM | POA: Diagnosis not present

## 2021-11-26 DIAGNOSIS — R69 Illness, unspecified: Secondary | ICD-10-CM | POA: Diagnosis not present

## 2021-11-26 DIAGNOSIS — F439 Reaction to severe stress, unspecified: Secondary | ICD-10-CM | POA: Diagnosis not present

## 2021-11-28 DIAGNOSIS — R69 Illness, unspecified: Secondary | ICD-10-CM | POA: Diagnosis not present

## 2021-11-29 DIAGNOSIS — M25511 Pain in right shoulder: Secondary | ICD-10-CM | POA: Diagnosis not present

## 2021-11-29 DIAGNOSIS — M9901 Segmental and somatic dysfunction of cervical region: Secondary | ICD-10-CM | POA: Diagnosis not present

## 2021-11-29 DIAGNOSIS — M4727 Other spondylosis with radiculopathy, lumbosacral region: Secondary | ICD-10-CM | POA: Diagnosis not present

## 2021-11-29 DIAGNOSIS — M9902 Segmental and somatic dysfunction of thoracic region: Secondary | ICD-10-CM | POA: Diagnosis not present

## 2021-11-29 DIAGNOSIS — M4726 Other spondylosis with radiculopathy, lumbar region: Secondary | ICD-10-CM | POA: Diagnosis not present

## 2021-11-29 DIAGNOSIS — M4723 Other spondylosis with radiculopathy, cervicothoracic region: Secondary | ICD-10-CM | POA: Diagnosis not present

## 2021-11-29 DIAGNOSIS — M9903 Segmental and somatic dysfunction of lumbar region: Secondary | ICD-10-CM | POA: Diagnosis not present

## 2021-11-29 DIAGNOSIS — R293 Abnormal posture: Secondary | ICD-10-CM | POA: Diagnosis not present

## 2021-12-03 DIAGNOSIS — F439 Reaction to severe stress, unspecified: Secondary | ICD-10-CM | POA: Diagnosis not present

## 2021-12-03 DIAGNOSIS — R69 Illness, unspecified: Secondary | ICD-10-CM | POA: Diagnosis not present

## 2021-12-05 DIAGNOSIS — R69 Illness, unspecified: Secondary | ICD-10-CM | POA: Diagnosis not present

## 2021-12-07 DIAGNOSIS — Z1211 Encounter for screening for malignant neoplasm of colon: Secondary | ICD-10-CM | POA: Diagnosis not present

## 2021-12-11 DIAGNOSIS — F32A Depression, unspecified: Secondary | ICD-10-CM | POA: Diagnosis not present

## 2021-12-11 DIAGNOSIS — R69 Illness, unspecified: Secondary | ICD-10-CM | POA: Diagnosis not present

## 2021-12-11 DIAGNOSIS — F439 Reaction to severe stress, unspecified: Secondary | ICD-10-CM | POA: Diagnosis not present

## 2021-12-12 DIAGNOSIS — R69 Illness, unspecified: Secondary | ICD-10-CM | POA: Diagnosis not present

## 2021-12-19 DIAGNOSIS — R69 Illness, unspecified: Secondary | ICD-10-CM | POA: Diagnosis not present

## 2021-12-20 DIAGNOSIS — F439 Reaction to severe stress, unspecified: Secondary | ICD-10-CM | POA: Diagnosis not present

## 2021-12-20 DIAGNOSIS — F419 Anxiety disorder, unspecified: Secondary | ICD-10-CM | POA: Diagnosis not present

## 2021-12-20 DIAGNOSIS — R69 Illness, unspecified: Secondary | ICD-10-CM | POA: Diagnosis not present

## 2021-12-26 DIAGNOSIS — G4700 Insomnia, unspecified: Secondary | ICD-10-CM | POA: Diagnosis not present

## 2021-12-26 DIAGNOSIS — Z23 Encounter for immunization: Secondary | ICD-10-CM | POA: Diagnosis not present

## 2021-12-26 DIAGNOSIS — R69 Illness, unspecified: Secondary | ICD-10-CM | POA: Diagnosis not present

## 2021-12-27 DIAGNOSIS — R69 Illness, unspecified: Secondary | ICD-10-CM | POA: Diagnosis not present

## 2021-12-27 DIAGNOSIS — F419 Anxiety disorder, unspecified: Secondary | ICD-10-CM | POA: Diagnosis not present

## 2021-12-27 DIAGNOSIS — F439 Reaction to severe stress, unspecified: Secondary | ICD-10-CM | POA: Diagnosis not present

## 2022-01-09 DIAGNOSIS — R69 Illness, unspecified: Secondary | ICD-10-CM | POA: Diagnosis not present

## 2022-01-09 DIAGNOSIS — F419 Anxiety disorder, unspecified: Secondary | ICD-10-CM | POA: Diagnosis not present

## 2022-01-09 DIAGNOSIS — F439 Reaction to severe stress, unspecified: Secondary | ICD-10-CM | POA: Diagnosis not present

## 2022-01-14 DIAGNOSIS — J40 Bronchitis, not specified as acute or chronic: Secondary | ICD-10-CM | POA: Diagnosis not present

## 2022-01-14 DIAGNOSIS — J329 Chronic sinusitis, unspecified: Secondary | ICD-10-CM | POA: Diagnosis not present

## 2022-01-14 DIAGNOSIS — B9689 Other specified bacterial agents as the cause of diseases classified elsewhere: Secondary | ICD-10-CM | POA: Diagnosis not present

## 2022-01-14 DIAGNOSIS — J069 Acute upper respiratory infection, unspecified: Secondary | ICD-10-CM | POA: Diagnosis not present

## 2022-01-15 DIAGNOSIS — F419 Anxiety disorder, unspecified: Secondary | ICD-10-CM | POA: Diagnosis not present

## 2022-01-15 DIAGNOSIS — F439 Reaction to severe stress, unspecified: Secondary | ICD-10-CM | POA: Diagnosis not present

## 2022-01-15 DIAGNOSIS — R69 Illness, unspecified: Secondary | ICD-10-CM | POA: Diagnosis not present

## 2022-01-23 DIAGNOSIS — R69 Illness, unspecified: Secondary | ICD-10-CM | POA: Diagnosis not present

## 2022-01-23 DIAGNOSIS — G4700 Insomnia, unspecified: Secondary | ICD-10-CM | POA: Diagnosis not present

## 2022-01-23 DIAGNOSIS — J309 Allergic rhinitis, unspecified: Secondary | ICD-10-CM | POA: Diagnosis not present

## 2022-01-28 DIAGNOSIS — F439 Reaction to severe stress, unspecified: Secondary | ICD-10-CM | POA: Diagnosis not present

## 2022-01-28 DIAGNOSIS — R69 Illness, unspecified: Secondary | ICD-10-CM | POA: Diagnosis not present

## 2022-01-28 DIAGNOSIS — F419 Anxiety disorder, unspecified: Secondary | ICD-10-CM | POA: Diagnosis not present

## 2022-02-05 DIAGNOSIS — Z0000 Encounter for general adult medical examination without abnormal findings: Secondary | ICD-10-CM | POA: Diagnosis not present

## 2022-02-05 DIAGNOSIS — Z125 Encounter for screening for malignant neoplasm of prostate: Secondary | ICD-10-CM | POA: Diagnosis not present

## 2022-02-06 DIAGNOSIS — Z0000 Encounter for general adult medical examination without abnormal findings: Secondary | ICD-10-CM | POA: Diagnosis not present

## 2022-02-06 DIAGNOSIS — R69 Illness, unspecified: Secondary | ICD-10-CM | POA: Diagnosis not present

## 2022-02-06 DIAGNOSIS — Z23 Encounter for immunization: Secondary | ICD-10-CM | POA: Diagnosis not present

## 2022-02-07 DIAGNOSIS — F439 Reaction to severe stress, unspecified: Secondary | ICD-10-CM | POA: Diagnosis not present

## 2022-02-07 DIAGNOSIS — R69 Illness, unspecified: Secondary | ICD-10-CM | POA: Diagnosis not present

## 2022-02-07 DIAGNOSIS — F419 Anxiety disorder, unspecified: Secondary | ICD-10-CM | POA: Diagnosis not present

## 2022-03-12 DIAGNOSIS — F439 Reaction to severe stress, unspecified: Secondary | ICD-10-CM | POA: Diagnosis not present

## 2022-03-12 DIAGNOSIS — F419 Anxiety disorder, unspecified: Secondary | ICD-10-CM | POA: Diagnosis not present

## 2022-03-12 DIAGNOSIS — R69 Illness, unspecified: Secondary | ICD-10-CM | POA: Diagnosis not present

## 2022-03-28 DIAGNOSIS — G4700 Insomnia, unspecified: Secondary | ICD-10-CM | POA: Diagnosis not present

## 2022-03-28 DIAGNOSIS — I10 Essential (primary) hypertension: Secondary | ICD-10-CM | POA: Diagnosis not present

## 2022-03-28 DIAGNOSIS — E663 Overweight: Secondary | ICD-10-CM | POA: Diagnosis not present

## 2022-03-28 DIAGNOSIS — Z6832 Body mass index (BMI) 32.0-32.9, adult: Secondary | ICD-10-CM | POA: Diagnosis not present

## 2022-03-28 DIAGNOSIS — R69 Illness, unspecified: Secondary | ICD-10-CM | POA: Diagnosis not present

## 2022-04-07 IMAGING — CT CT ABD-PELV W/O CM
2 of 4 series · 13 of 46 positions shown, 15 images · non-contrast
Comparison: None.

CLINICAL DATA: Left lower quadrant and left flank pain. Rule out
kidney stone.

EXAM:
CT ABDOMEN AND PELVIS WITHOUT CONTRAST
TECHNIQUE: Multidetector CT imaging of the abdomen and pelvis was performed
following the standard protocol without IV contrast.

[Series 2: renal stone 5.00 br40 s3 axial · axial · 0.70mm/px · z∈[+1280,+1720]mm · 10 of 106 slices shown, 12 images]
[im 9/106  soft-tissue]
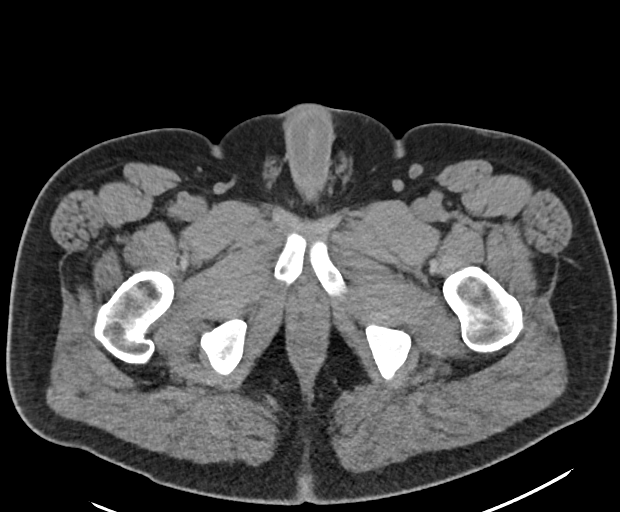
[im 9/106  bone]
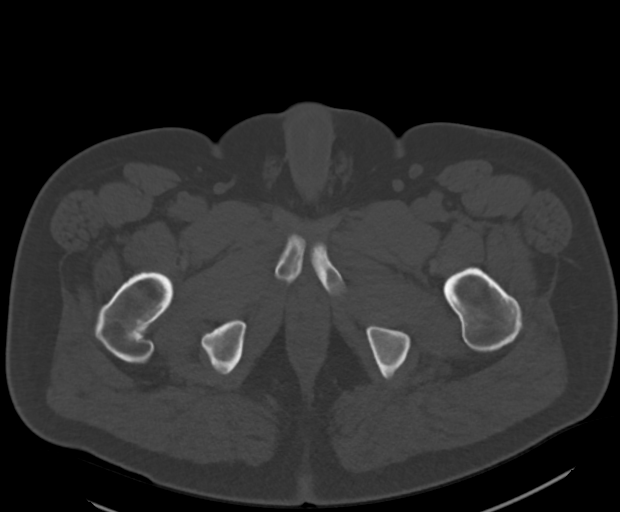
[im 17/106  soft-tissue]
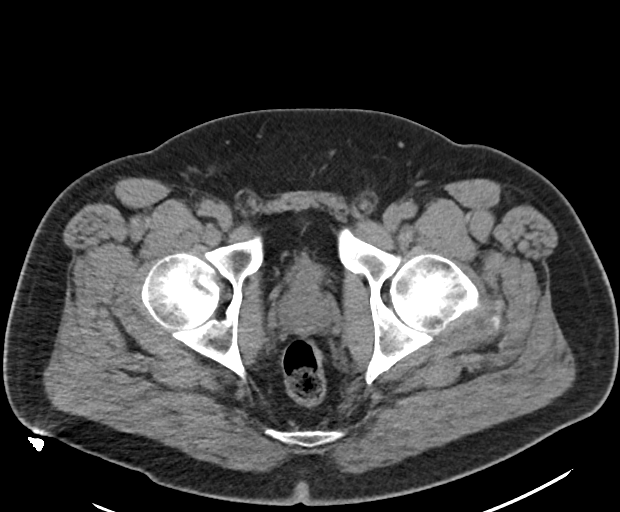
[im 30/106  soft-tissue]
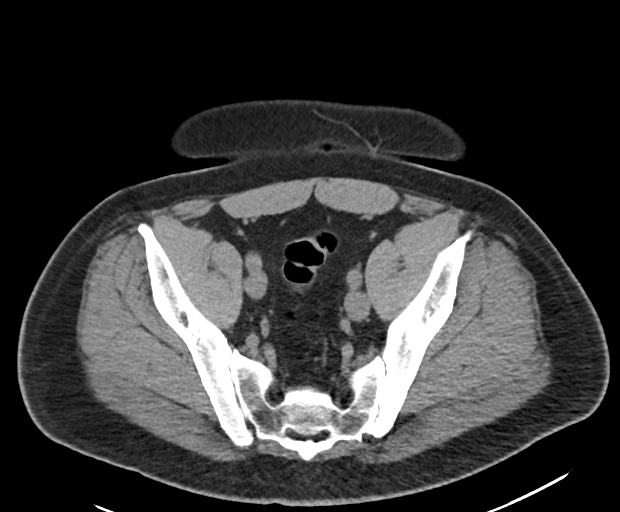
[im 38/106  soft-tissue]
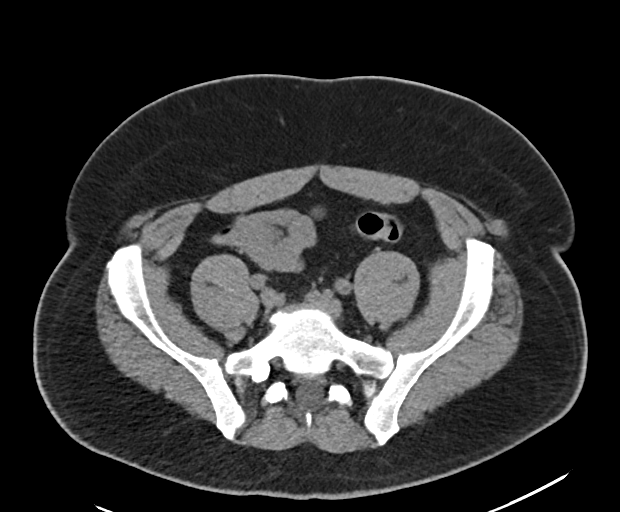
[im 47/106  soft-tissue]
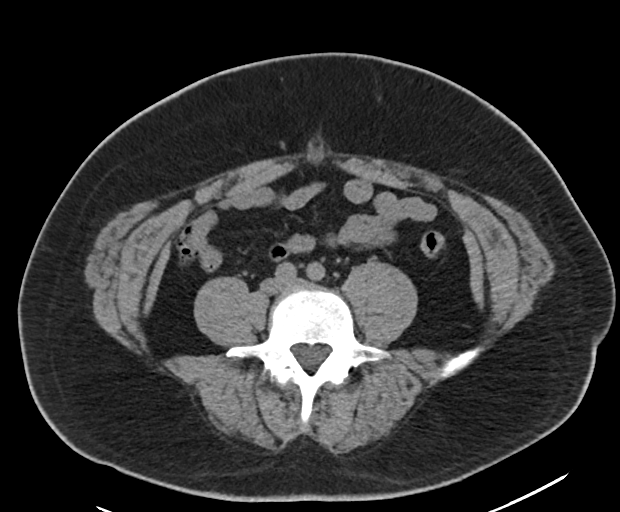
[im 59/106  soft-tissue]
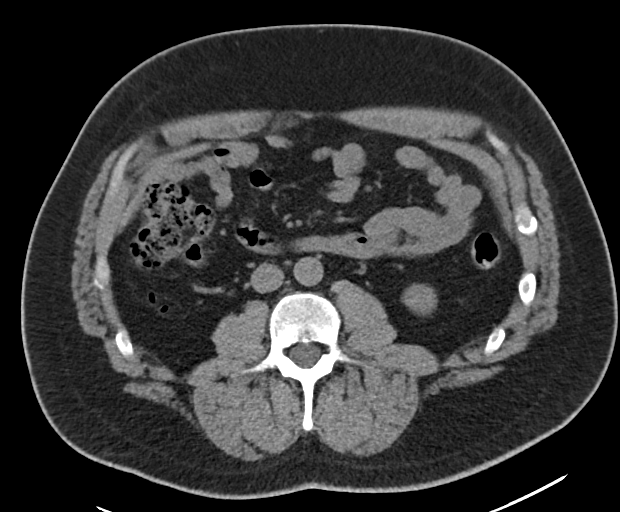
[im 68/106  soft-tissue]
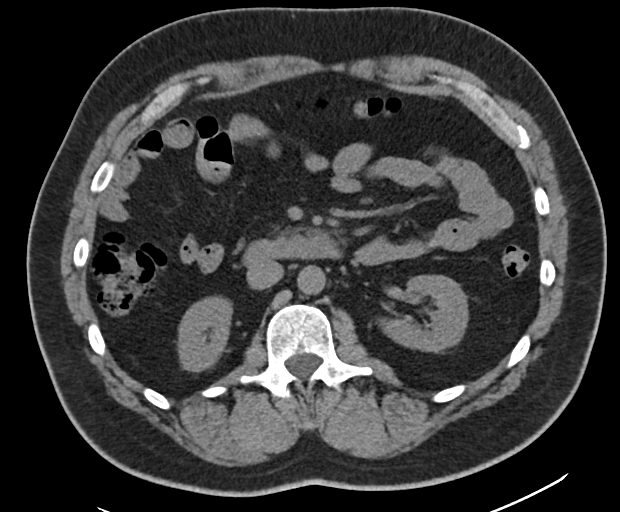
[im 80/106  soft-tissue]
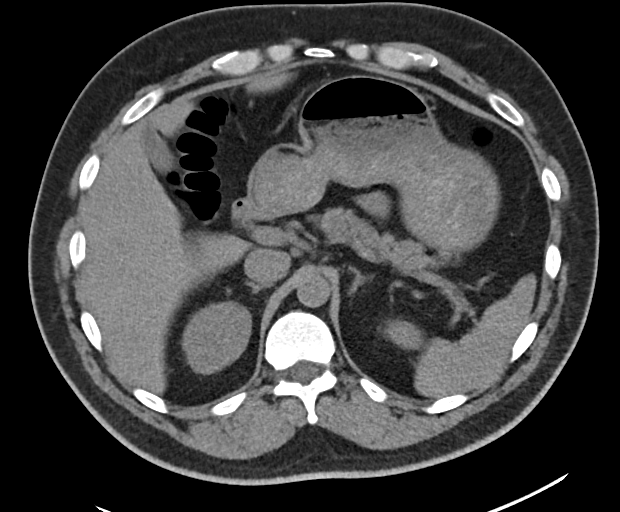
[im 89/106  soft-tissue]
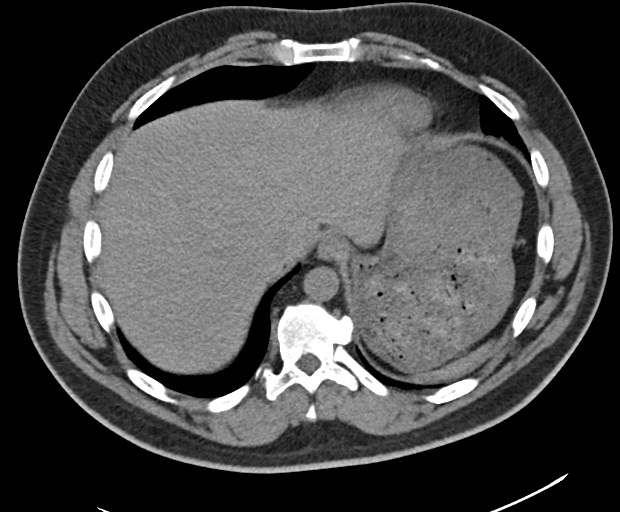
[im 89/106  bone]
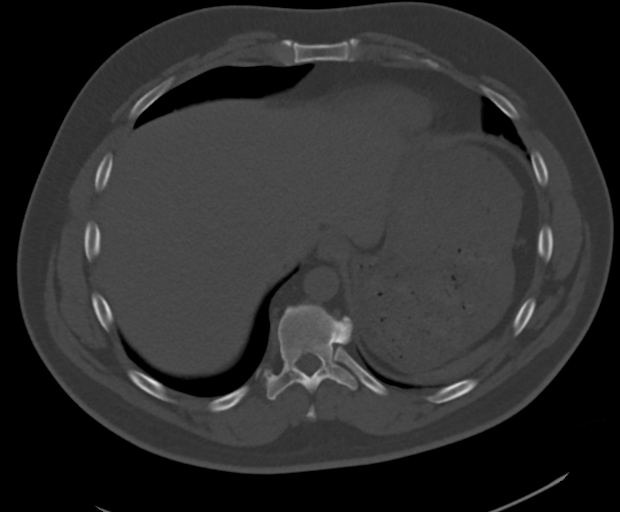
[im 97/106  soft-tissue]
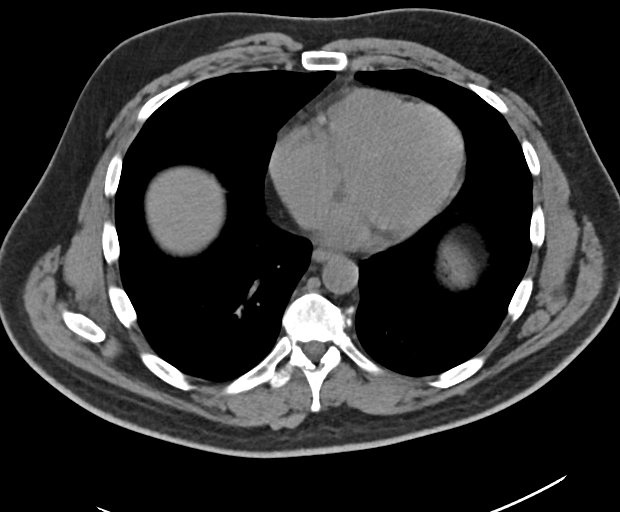

[Series 6: renal stone 2.00 br40 s3 cor · coronal · 0.85mm/px · 3 of 178 slices shown]
[im 60/178  soft-tissue]
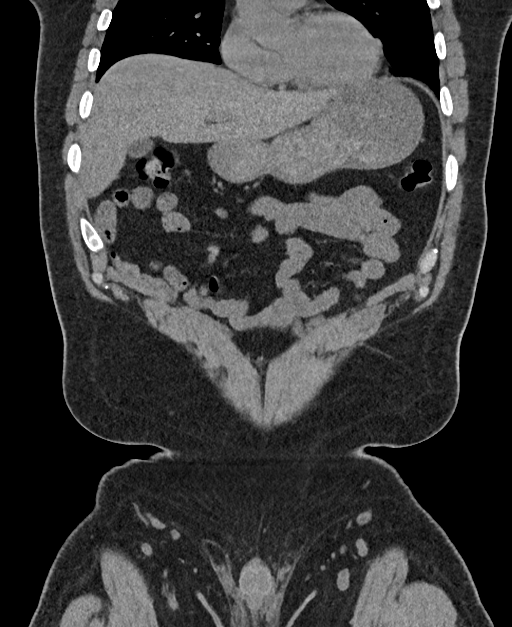
[im 79/178  soft-tissue]
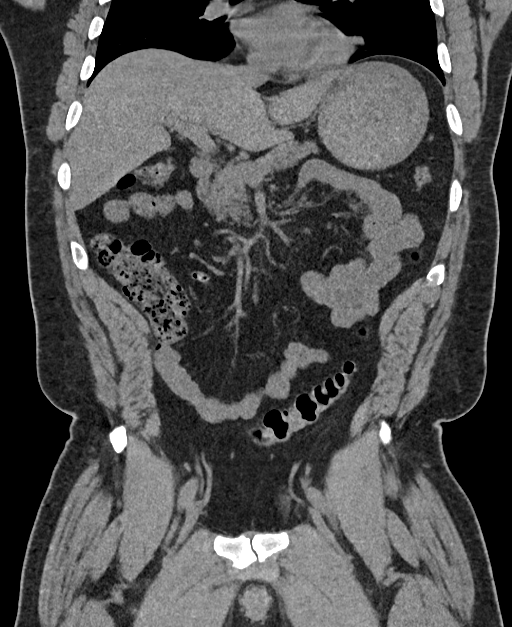
[im 99/178  soft-tissue]
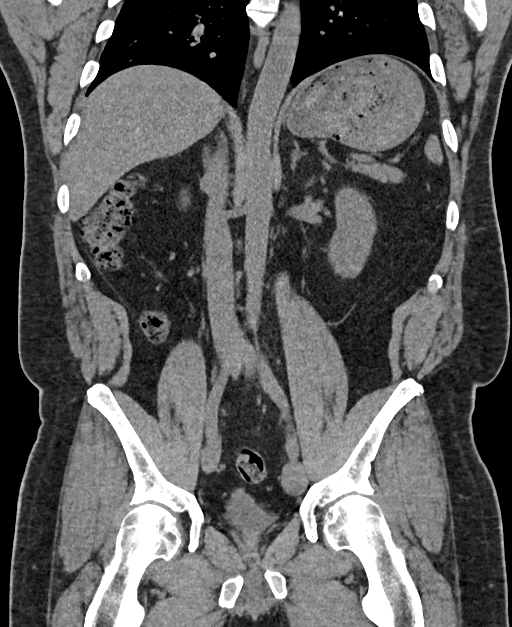

[13 of 46 positions shown; findings below may reference images not displayed]

FINDINGS: Lower chest: Subsegmental atelectasis in the lingula and left lower
lobe. No confluent airspace disease. No pleural fluid.

Hepatobiliary: Borderline hepatic steatosis. No evidence of focal
hepatic lesion on noncontrast exam. Gallbladder partially distended,
no calcified stone. No biliary dilatation.

Pancreas: Unremarkable noncontrast appearance. No ductal dilatation
or inflammation.

Spleen: Normal in size without focal abnormality.

Adrenals/Urinary Tract: Normal adrenal glands. No hydronephrosis. No
significant perinephric edema. Possible punctate nonobstructing
stone in the lower left kidney, series 6 image 110. No obstructive
uropathy. Both ureters are decompressed without stones along the
course. Nondistended urinary bladder without stones. No obvious wall
thickening.

Stomach/Bowel: Ingested material distends the stomach. No gastric
wall thickening. Normal positioning of the ligament of Treitz. No
small bowel obstruction or inflammation. Normal appendix. Small to
moderate colonic stool burden. Minor diverticulosis of the distal
descending and sigmoid colon. No diverticulitis. No colonic wall
thickening or inflammation.

Vascular/Lymphatic: Normal caliber abdominal aorta. No enlarged
lymph nodes in the abdomen or pelvis.

Reproductive: Prostate is unremarkable.

Other: No inguinal or body wall hernia. No ascites. No free air.

Musculoskeletal: Degenerative disc disease at L4-L5. Transitional
lumbosacral anatomy. There are no acute or suspicious osseous
abnormalities.
IMPRESSION: 1. No hydronephrosis or obstructive uropathy. Possible punctate
nonobstructing stone in the lower left kidney.
2. Minor colonic diverticulosis without diverticulitis.
3. Borderline hepatic steatosis.

## 2022-06-04 DIAGNOSIS — H524 Presbyopia: Secondary | ICD-10-CM | POA: Diagnosis not present

## 2022-06-26 DIAGNOSIS — G4700 Insomnia, unspecified: Secondary | ICD-10-CM | POA: Diagnosis not present

## 2022-06-26 DIAGNOSIS — J309 Allergic rhinitis, unspecified: Secondary | ICD-10-CM | POA: Diagnosis not present

## 2022-06-26 DIAGNOSIS — I10 Essential (primary) hypertension: Secondary | ICD-10-CM | POA: Diagnosis not present

## 2022-06-26 DIAGNOSIS — F411 Generalized anxiety disorder: Secondary | ICD-10-CM | POA: Diagnosis not present

## 2022-08-02 DIAGNOSIS — G40909 Epilepsy, unspecified, not intractable, without status epilepticus: Secondary | ICD-10-CM | POA: Diagnosis not present

## 2022-08-02 DIAGNOSIS — R569 Unspecified convulsions: Secondary | ICD-10-CM | POA: Diagnosis not present

## 2022-10-07 DIAGNOSIS — E038 Other specified hypothyroidism: Secondary | ICD-10-CM | POA: Diagnosis not present

## 2022-10-07 DIAGNOSIS — E291 Testicular hypofunction: Secondary | ICD-10-CM | POA: Diagnosis not present

## 2022-10-17 DIAGNOSIS — R6882 Decreased libido: Secondary | ICD-10-CM | POA: Diagnosis not present

## 2022-10-17 DIAGNOSIS — E669 Obesity, unspecified: Secondary | ICD-10-CM | POA: Diagnosis not present

## 2022-10-17 DIAGNOSIS — I10 Essential (primary) hypertension: Secondary | ICD-10-CM | POA: Diagnosis not present

## 2022-10-17 DIAGNOSIS — E038 Other specified hypothyroidism: Secondary | ICD-10-CM | POA: Diagnosis not present

## 2022-10-17 DIAGNOSIS — E291 Testicular hypofunction: Secondary | ICD-10-CM | POA: Diagnosis not present

## 2022-10-17 DIAGNOSIS — G479 Sleep disorder, unspecified: Secondary | ICD-10-CM | POA: Diagnosis not present

## 2022-10-17 DIAGNOSIS — Z6832 Body mass index (BMI) 32.0-32.9, adult: Secondary | ICD-10-CM | POA: Diagnosis not present

## 2022-10-17 DIAGNOSIS — M2550 Pain in unspecified joint: Secondary | ICD-10-CM | POA: Diagnosis not present

## 2022-10-17 DIAGNOSIS — Z8669 Personal history of other diseases of the nervous system and sense organs: Secondary | ICD-10-CM | POA: Diagnosis not present

## 2022-10-17 DIAGNOSIS — F419 Anxiety disorder, unspecified: Secondary | ICD-10-CM | POA: Diagnosis not present

## 2022-10-30 DIAGNOSIS — J069 Acute upper respiratory infection, unspecified: Secondary | ICD-10-CM | POA: Diagnosis not present

## 2022-12-27 DIAGNOSIS — I10 Essential (primary) hypertension: Secondary | ICD-10-CM | POA: Diagnosis not present

## 2022-12-27 DIAGNOSIS — J309 Allergic rhinitis, unspecified: Secondary | ICD-10-CM | POA: Diagnosis not present

## 2022-12-27 DIAGNOSIS — G40209 Localization-related (focal) (partial) symptomatic epilepsy and epileptic syndromes with complex partial seizures, not intractable, without status epilepticus: Secondary | ICD-10-CM | POA: Diagnosis not present

## 2022-12-27 DIAGNOSIS — G4700 Insomnia, unspecified: Secondary | ICD-10-CM | POA: Diagnosis not present

## 2022-12-27 DIAGNOSIS — K581 Irritable bowel syndrome with constipation: Secondary | ICD-10-CM | POA: Diagnosis not present

## 2022-12-27 DIAGNOSIS — E669 Obesity, unspecified: Secondary | ICD-10-CM | POA: Diagnosis not present

## 2022-12-27 DIAGNOSIS — E785 Hyperlipidemia, unspecified: Secondary | ICD-10-CM | POA: Diagnosis not present

## 2022-12-27 DIAGNOSIS — N529 Male erectile dysfunction, unspecified: Secondary | ICD-10-CM | POA: Diagnosis not present

## 2023-01-10 DIAGNOSIS — G40909 Epilepsy, unspecified, not intractable, without status epilepticus: Secondary | ICD-10-CM | POA: Diagnosis not present

## 2023-01-10 DIAGNOSIS — G40009 Localization-related (focal) (partial) idiopathic epilepsy and epileptic syndromes with seizures of localized onset, not intractable, without status epilepticus: Secondary | ICD-10-CM | POA: Diagnosis not present

## 2023-01-27 DIAGNOSIS — Z713 Dietary counseling and surveillance: Secondary | ICD-10-CM | POA: Diagnosis not present

## 2023-01-27 DIAGNOSIS — I10 Essential (primary) hypertension: Secondary | ICD-10-CM | POA: Diagnosis not present

## 2023-01-27 DIAGNOSIS — E669 Obesity, unspecified: Secondary | ICD-10-CM | POA: Diagnosis not present

## 2023-02-19 DIAGNOSIS — Z0000 Encounter for general adult medical examination without abnormal findings: Secondary | ICD-10-CM | POA: Diagnosis not present

## 2023-02-19 DIAGNOSIS — E782 Mixed hyperlipidemia: Secondary | ICD-10-CM | POA: Diagnosis not present

## 2023-02-19 DIAGNOSIS — Z114 Encounter for screening for human immunodeficiency virus [HIV]: Secondary | ICD-10-CM | POA: Diagnosis not present

## 2023-02-19 DIAGNOSIS — N12 Tubulo-interstitial nephritis, not specified as acute or chronic: Secondary | ICD-10-CM | POA: Diagnosis not present

## 2023-02-19 DIAGNOSIS — Z125 Encounter for screening for malignant neoplasm of prostate: Secondary | ICD-10-CM | POA: Diagnosis not present

## 2023-02-19 DIAGNOSIS — I10 Essential (primary) hypertension: Secondary | ICD-10-CM | POA: Diagnosis not present

## 2023-02-19 DIAGNOSIS — Z13220 Encounter for screening for lipoid disorders: Secondary | ICD-10-CM | POA: Diagnosis not present

## 2023-02-21 DIAGNOSIS — Z0000 Encounter for general adult medical examination without abnormal findings: Secondary | ICD-10-CM | POA: Diagnosis not present

## 2023-02-21 DIAGNOSIS — Z23 Encounter for immunization: Secondary | ICD-10-CM | POA: Diagnosis not present

## 2023-02-21 DIAGNOSIS — I10 Essential (primary) hypertension: Secondary | ICD-10-CM | POA: Diagnosis not present

## 2023-06-03 ENCOUNTER — Other Ambulatory Visit (HOSPITAL_COMMUNITY): Payer: Self-pay
